# Patient Record
Sex: Female | Born: 1980 | Race: Black or African American | Hispanic: No | Marital: Married | Smoking: Former smoker
Health system: Southern US, Community
[De-identification: ages and names within clinical notes are randomized; demographics above are authoritative.]

## PROBLEM LIST (undated history)

## (undated) DIAGNOSIS — L989 Disorder of the skin and subcutaneous tissue, unspecified: Secondary | ICD-10-CM

## (undated) DIAGNOSIS — Z87891 Personal history of nicotine dependence: Secondary | ICD-10-CM

## (undated) DIAGNOSIS — Z8619 Personal history of other infectious and parasitic diseases: Secondary | ICD-10-CM

## (undated) DIAGNOSIS — D219 Benign neoplasm of connective and other soft tissue, unspecified: Secondary | ICD-10-CM

## (undated) DIAGNOSIS — J302 Other seasonal allergic rhinitis: Secondary | ICD-10-CM

## (undated) DIAGNOSIS — K5900 Constipation, unspecified: Secondary | ICD-10-CM

## (undated) DIAGNOSIS — K5790 Diverticulosis of intestine, part unspecified, without perforation or abscess without bleeding: Secondary | ICD-10-CM

## (undated) DIAGNOSIS — F419 Anxiety disorder, unspecified: Secondary | ICD-10-CM

## (undated) DIAGNOSIS — K7689 Other specified diseases of liver: Secondary | ICD-10-CM

## (undated) DIAGNOSIS — D259 Leiomyoma of uterus, unspecified: Secondary | ICD-10-CM

## (undated) HISTORY — DX: Diverticulosis of intestine, part unspecified, without perforation or abscess without bleeding: K57.90

## (undated) HISTORY — PX: COLONOSCOPY: SHX174

## (undated) HISTORY — DX: Disorder of the skin and subcutaneous tissue, unspecified: L98.9

## (undated) HISTORY — DX: Constipation, unspecified: K59.00

## (undated) HISTORY — DX: Other seasonal allergic rhinitis: J30.2

## (undated) HISTORY — DX: Leiomyoma of uterus, unspecified: D25.9

## (undated) HISTORY — PX: BREAST BIOPSY: SHX20

## (undated) HISTORY — DX: Personal history of nicotine dependence: Z87.891

## (undated) HISTORY — DX: Other specified diseases of liver: K76.89

## (undated) HISTORY — DX: Personal history of other infectious and parasitic diseases: Z86.19

---

## 2004-03-03 ENCOUNTER — Emergency Department (HOSPITAL_COMMUNITY): Admission: EM | Admit: 2004-03-03 | Discharge: 2004-03-03 | Payer: Self-pay | Admitting: Emergency Medicine

## 2004-08-08 ENCOUNTER — Emergency Department (HOSPITAL_COMMUNITY): Admission: EM | Admit: 2004-08-08 | Discharge: 2004-08-08 | Payer: Self-pay | Admitting: Emergency Medicine

## 2004-09-10 ENCOUNTER — Other Ambulatory Visit: Admission: RE | Admit: 2004-09-10 | Discharge: 2004-09-10 | Payer: Self-pay | Admitting: Obstetrics and Gynecology

## 2008-02-14 ENCOUNTER — Emergency Department (HOSPITAL_COMMUNITY): Admission: EM | Admit: 2008-02-14 | Discharge: 2008-02-14 | Payer: Self-pay | Admitting: Emergency Medicine

## 2009-11-01 HISTORY — PX: WISDOM TOOTH EXTRACTION: SHX21

## 2010-05-06 ENCOUNTER — Emergency Department (HOSPITAL_COMMUNITY): Admission: EM | Admit: 2010-05-06 | Discharge: 2010-05-06 | Payer: Self-pay | Admitting: Family Medicine

## 2012-01-10 ENCOUNTER — Other Ambulatory Visit: Payer: Self-pay | Admitting: Obstetrics and Gynecology

## 2012-01-10 DIAGNOSIS — D219 Benign neoplasm of connective and other soft tissue, unspecified: Secondary | ICD-10-CM

## 2012-01-21 ENCOUNTER — Ambulatory Visit
Admission: RE | Admit: 2012-01-21 | Discharge: 2012-01-21 | Disposition: A | Discharge status: Home / Self Care | Payer: Managed Care, Other (non HMO) | Source: Ambulatory Visit | Attending: Obstetrics and Gynecology | Admitting: Obstetrics and Gynecology

## 2012-01-21 DIAGNOSIS — D219 Benign neoplasm of connective and other soft tissue, unspecified: Secondary | ICD-10-CM

## 2012-01-21 MED ORDER — GADOBENATE DIMEGLUMINE 529 MG/ML IV SOLN: 12.0000 mL | Freq: Once | INTRAVENOUS | Status: AC | PRN

## 2012-04-19 ENCOUNTER — Encounter (HOSPITAL_COMMUNITY): Payer: Self-pay | Admitting: Pharmacist

## 2012-04-20 ENCOUNTER — Encounter (HOSPITAL_COMMUNITY)
Admission: RE | Admit: 2012-04-20 | Discharge: 2012-04-20 | Disposition: A | Discharge status: Home / Self Care | Payer: Managed Care, Other (non HMO) | Source: Ambulatory Visit | Attending: Obstetrics and Gynecology | Admitting: Obstetrics and Gynecology

## 2012-04-20 ENCOUNTER — Encounter (HOSPITAL_COMMUNITY): Payer: Self-pay

## 2012-04-20 HISTORY — DX: Benign neoplasm of connective and other soft tissue, unspecified: D21.9

## 2012-04-20 LAB — SURGICAL PCR SCREEN: Staphylococcus aureus: POSITIVE — AB

## 2012-04-20 NOTE — Patient Instructions (Addendum)
   Your procedure is scheduled on:Wednesday July 3rd  Enter through the [employer] of [facility] at: 11:30am Pick up the phone at the desk and dial [phone] and inform us of your arrival.  Please call this number if you have any problems the morning of surgery: [phone]  Remember: Do not eat food after midnight: Tuesday Do not drink clear liquids after: 9am Wednesday Take these medicines the morning of surgery with a SIP OF WATER:none  Do not wear jewelry, make-up, or FINGER nail polish Do not wear lotions, powders, perfumes or deodorant. Do not shave 48 hours prior to surgery. Do not bring valuables to the hospital. Contacts, dentures or bridgework may not be worn into surgery.  Leave suitcase in the car. After Surgery it may be brought to your room. For patients being admitted to the hospital, checkout time is 11:00am the day of discharge.  Patients discharged on the day of surgery will not be allowed to drive home.     Remember to use your hibiclens as instructed.Please shower with 1/2 bottle the evening before your surgery and the other 1/2 bottle the morning of surgery. Neck down avoiding private area.

## 2012-04-25 ENCOUNTER — Other Ambulatory Visit: Payer: Self-pay | Admitting: Obstetrics and Gynecology

## 2012-05-03 ENCOUNTER — Encounter (HOSPITAL_COMMUNITY): Payer: Self-pay

## 2012-05-03 ENCOUNTER — Inpatient Hospital Stay (HOSPITAL_COMMUNITY)
Admission: RE | Admit: 2012-05-03 | Discharge: 2012-05-04 | DRG: 743 | Disposition: A | Discharge status: Home / Self Care | Payer: Managed Care, Other (non HMO) | Source: Ambulatory Visit | Attending: Obstetrics and Gynecology | Admitting: Obstetrics and Gynecology

## 2012-05-03 ENCOUNTER — Encounter (HOSPITAL_COMMUNITY)
Admission: RE | Disposition: A | Discharge status: Home / Self Care | Payer: Self-pay | Source: Ambulatory Visit | Attending: Obstetrics and Gynecology

## 2012-05-03 ENCOUNTER — Ambulatory Visit (HOSPITAL_COMMUNITY): Payer: Managed Care, Other (non HMO)

## 2012-05-03 DIAGNOSIS — D251 Intramural leiomyoma of uterus: Secondary | ICD-10-CM | POA: Diagnosis present

## 2012-05-03 DIAGNOSIS — Z9889 Other specified postprocedural states: Secondary | ICD-10-CM

## 2012-05-03 DIAGNOSIS — D252 Subserosal leiomyoma of uterus: Secondary | ICD-10-CM | POA: Diagnosis present

## 2012-05-03 DIAGNOSIS — D250 Submucous leiomyoma of uterus: Principal | ICD-10-CM | POA: Diagnosis present

## 2012-05-03 HISTORY — PX: ROBOT ASSISTED MYOMECTOMY: SHX5142

## 2012-05-03 HISTORY — PX: MYOMECTOMY: SHX85

## 2012-05-03 LAB — CBC
HCT: 40.7 % (ref 36.0–46.0)
Hemoglobin: 12.9 g/dL (ref 12.0–15.0)
MCHC: 31.7 g/dL (ref 30.0–36.0)
MCV: 83.1 fL (ref 78.0–100.0)

## 2012-05-03 SURGERY — ROBOTIC ASSISTED MYOMECTOMY
Anesthesia: General | Site: Abdomen | Wound class: Clean Contaminated

## 2012-05-03 MED ORDER — KETOROLAC TROMETHAMINE 30 MG/ML IJ SOLN
INTRAMUSCULAR | Status: AC
  Filled 2012-05-03: qty 1

## 2012-05-03 MED ORDER — OXYCODONE-ACETAMINOPHEN 5-325 MG PO TABS
1.0000 | ORAL_TABLET | ORAL | Status: DC | PRN
  Administered 2012-05-04 (×2): 2 via ORAL
  Filled 2012-05-03 (×2): qty 2

## 2012-05-03 MED ORDER — KETOROLAC TROMETHAMINE 30 MG/ML IJ SOLN
30.0000 mg | Freq: Four times a day (QID) | INTRAMUSCULAR | Status: DC
  Administered 2012-05-03 – 2012-05-04 (×3): 30 mg via INTRAVENOUS
  Filled 2012-05-03 (×2): qty 1

## 2012-05-03 MED ORDER — NEOSTIGMINE METHYLSULFATE 1 MG/ML IJ SOLN
INTRAMUSCULAR | Status: DC | PRN
  Administered 2012-05-03: 3 mg via INTRAVENOUS

## 2012-05-03 MED ORDER — CEFAZOLIN SODIUM-DEXTROSE 2-3 GM-% IV SOLR
INTRAVENOUS | Status: AC
  Filled 2012-05-03: qty 50

## 2012-05-03 MED ORDER — NEOSTIGMINE METHYLSULFATE 1 MG/ML IJ SOLN
INTRAMUSCULAR | Status: AC
  Filled 2012-05-03: qty 10

## 2012-05-03 MED ORDER — MEPERIDINE HCL 25 MG/ML IJ SOLN: 6.2500 mg | INTRAMUSCULAR | Status: DC | PRN

## 2012-05-03 MED ORDER — KETOROLAC TROMETHAMINE 30 MG/ML IJ SOLN: 30.0000 mg | Freq: Four times a day (QID) | INTRAMUSCULAR | Status: DC

## 2012-05-03 MED ORDER — MORPHINE SULFATE 4 MG/ML IJ SOLN: 1.0000 mg | INTRAMUSCULAR | Status: DC | PRN

## 2012-05-03 MED ORDER — PHENYLEPHRINE HCL 10 MG/ML IJ SOLN
INTRAMUSCULAR | Status: DC | PRN
  Administered 2012-05-03: 40 ug via INTRAVENOUS
  Administered 2012-05-03: 80 ug via INTRAVENOUS
  Administered 2012-05-03 (×2): 40 ug via INTRAVENOUS

## 2012-05-03 MED ORDER — PANTOPRAZOLE SODIUM 40 MG PO TBEC
40.0000 mg | DELAYED_RELEASE_TABLET | Freq: Every day | ORAL | Status: DC
  Administered 2012-05-03 – 2012-05-04 (×2): 40 mg via ORAL
  Filled 2012-05-03 (×3): qty 1

## 2012-05-03 MED ORDER — ARTIFICIAL TEARS OP OINT
TOPICAL_OINTMENT | OPHTHALMIC | Status: AC
  Filled 2012-05-03: qty 3.5

## 2012-05-03 MED ORDER — VASOPRESSIN 20 UNIT/ML IJ SOLN
INTRAMUSCULAR | Status: AC
  Filled 2012-05-03: qty 2

## 2012-05-03 MED ORDER — GLYCOPYRROLATE 0.2 MG/ML IJ SOLN
INTRAMUSCULAR | Status: DC | PRN
  Administered 2012-05-03: .6 mg via INTRAVENOUS

## 2012-05-03 MED ORDER — FENTANYL CITRATE 0.05 MG/ML IJ SOLN
INTRAMUSCULAR | Status: AC
  Filled 2012-05-03: qty 5

## 2012-05-03 MED ORDER — LIDOCAINE HCL (CARDIAC) 20 MG/ML IV SOLN
INTRAVENOUS | Status: AC
  Filled 2012-05-03: qty 5

## 2012-05-03 MED ORDER — FENTANYL CITRATE 0.05 MG/ML IJ SOLN
INTRAMUSCULAR | Status: DC | PRN
  Administered 2012-05-03: 100 ug via INTRAVENOUS
  Administered 2012-05-03 (×8): 50 ug via INTRAVENOUS

## 2012-05-03 MED ORDER — MIDAZOLAM HCL 2 MG/2ML IJ SOLN
INTRAMUSCULAR | Status: AC
  Filled 2012-05-03: qty 2

## 2012-05-03 MED ORDER — NALOXONE HCL 0.4 MG/ML IJ SOLN: 0.4000 mg | INTRAMUSCULAR | Status: DC | PRN

## 2012-05-03 MED ORDER — METOCLOPRAMIDE HCL 5 MG/ML IJ SOLN: 10.0000 mg | Freq: Once | INTRAMUSCULAR | Status: DC | PRN

## 2012-05-03 MED ORDER — DEXTROSE IN LACTATED RINGERS 5 % IV SOLN
INTRAVENOUS | Status: DC
  Administered 2012-05-03 – 2012-05-04 (×2): via INTRAVENOUS

## 2012-05-03 MED ORDER — DEXAMETHASONE SODIUM PHOSPHATE 10 MG/ML IJ SOLN
INTRAMUSCULAR | Status: AC
  Filled 2012-05-03: qty 1

## 2012-05-03 MED ORDER — PROPOFOL 10 MG/ML IV EMUL
INTRAVENOUS | Status: DC | PRN
  Administered 2012-05-03: 180 mg via INTRAVENOUS

## 2012-05-03 MED ORDER — BUPIVACAINE HCL (PF) 0.25 % IJ SOLN
INTRAMUSCULAR | Status: DC | PRN
  Administered 2012-05-03: 15 mL

## 2012-05-03 MED ORDER — MIDAZOLAM HCL 5 MG/5ML IJ SOLN
INTRAMUSCULAR | Status: DC | PRN
  Administered 2012-05-03: 2 mg via INTRAVENOUS

## 2012-05-03 MED ORDER — MENTHOL 3 MG MT LOZG: 1.0000 | LOZENGE | OROMUCOSAL | Status: DC | PRN

## 2012-05-03 MED ORDER — PROPOFOL 10 MG/ML IV EMUL
INTRAVENOUS | Status: AC
  Filled 2012-05-03: qty 20

## 2012-05-03 MED ORDER — ACETAMINOPHEN 10 MG/ML IV SOLN
1000.0000 mg | Freq: Four times a day (QID) | INTRAVENOUS | Status: DC
  Administered 2012-05-03: 1000 mg via INTRAVENOUS
  Filled 2012-05-03: qty 100

## 2012-05-03 MED ORDER — ONDANSETRON HCL 4 MG/2ML IJ SOLN: 4.0000 mg | Freq: Four times a day (QID) | INTRAMUSCULAR | Status: DC | PRN

## 2012-05-03 MED ORDER — CEFAZOLIN SODIUM 1-5 GM-% IV SOLN
1.0000 g | Freq: Three times a day (TID) | INTRAVENOUS | Status: AC
  Administered 2012-05-03 – 2012-05-04 (×2): 1 g via INTRAVENOUS
  Filled 2012-05-03 (×2): qty 50

## 2012-05-03 MED ORDER — LACTATED RINGERS IV SOLN
INTRAVENOUS | Status: DC
Start: 2012-05-03 — End: 2012-05-03
  Administered 2012-05-03 (×5): via INTRAVENOUS

## 2012-05-03 MED ORDER — HYDROMORPHONE HCL PF 1 MG/ML IJ SOLN: 0.2000 mg | INTRAMUSCULAR | Status: DC | PRN

## 2012-05-03 MED ORDER — SODIUM CHLORIDE 0.9 % IJ SOLN: 9.0000 mL | INTRAMUSCULAR | Status: DC | PRN

## 2012-05-03 MED ORDER — ONDANSETRON HCL 4 MG/2ML IJ SOLN
INTRAMUSCULAR | Status: DC | PRN
  Administered 2012-05-03: 4 mg via INTRAVENOUS

## 2012-05-03 MED ORDER — CEFAZOLIN SODIUM-DEXTROSE 2-3 GM-% IV SOLR
2.0000 g | INTRAVENOUS | Status: AC
  Administered 2012-05-03: 2 g via INTRAVENOUS

## 2012-05-03 MED ORDER — ARTIFICIAL TEARS OP OINT
TOPICAL_OINTMENT | OPHTHALMIC | Status: DC | PRN
  Administered 2012-05-03: 1 via OPHTHALMIC

## 2012-05-03 MED ORDER — DIPHENHYDRAMINE HCL 12.5 MG/5ML PO ELIX: 12.5000 mg | ORAL_SOLUTION | Freq: Four times a day (QID) | ORAL | Status: DC | PRN

## 2012-05-03 MED ORDER — HYDROMORPHONE 0.3 MG/ML IV SOLN
INTRAVENOUS | Status: DC
  Administered 2012-05-03: 19:00:00 via INTRAVENOUS
  Administered 2012-05-03: 0.999 mg via INTRAVENOUS
  Administered 2012-05-04: 0.2 mg via INTRAVENOUS
  Administered 2012-05-04: 0.999 mg via INTRAVENOUS
  Filled 2012-05-03: qty 25

## 2012-05-03 MED ORDER — DIPHENHYDRAMINE HCL 50 MG/ML IJ SOLN: 12.5000 mg | Freq: Four times a day (QID) | INTRAMUSCULAR | Status: DC | PRN

## 2012-05-03 MED ORDER — ACETAMINOPHEN 10 MG/ML IV SOLN
INTRAVENOUS | Status: DC | PRN
  Administered 2012-05-03: 1000 mg via INTRAVENOUS

## 2012-05-03 MED ORDER — IBUPROFEN 800 MG PO TABS
800.0000 mg | ORAL_TABLET | Freq: Three times a day (TID) | ORAL | Status: DC | PRN
  Administered 2012-05-04: 800 mg via ORAL
  Filled 2012-05-03: qty 1

## 2012-05-03 MED ORDER — ROCURONIUM BROMIDE 50 MG/5ML IV SOLN
INTRAVENOUS | Status: AC
  Filled 2012-05-03: qty 1

## 2012-05-03 MED ORDER — GLYCOPYRROLATE 0.2 MG/ML IJ SOLN
INTRAMUSCULAR | Status: AC
  Filled 2012-05-03: qty 2

## 2012-05-03 MED ORDER — SCOPOLAMINE 1 MG/3DAYS TD PT72
MEDICATED_PATCH | TRANSDERMAL | Status: AC
  Administered 2012-05-03: 1.5 mg via TRANSDERMAL
  Filled 2012-05-03: qty 1

## 2012-05-03 MED ORDER — LACTATED RINGERS IR SOLN
Status: DC | PRN
  Administered 2012-05-03: 3000 mL

## 2012-05-03 MED ORDER — LIDOCAINE HCL (CARDIAC) 20 MG/ML IV SOLN
INTRAVENOUS | Status: DC | PRN
  Administered 2012-05-03: 40 mg via INTRAVENOUS

## 2012-05-03 MED ORDER — ONDANSETRON HCL 4 MG/2ML IJ SOLN
INTRAMUSCULAR | Status: AC
  Filled 2012-05-03: qty 2

## 2012-05-03 MED ORDER — INDIGOTINDISULFONATE SODIUM 8 MG/ML IJ SOLN
INTRAMUSCULAR | Status: AC
  Filled 2012-05-03: qty 5

## 2012-05-03 MED ORDER — BUPIVACAINE HCL (PF) 0.25 % IJ SOLN
INTRAMUSCULAR | Status: AC
  Filled 2012-05-03: qty 30

## 2012-05-03 MED ORDER — ROCURONIUM BROMIDE 100 MG/10ML IV SOLN
INTRAVENOUS | Status: DC | PRN
  Administered 2012-05-03: 10 mg via INTRAVENOUS
  Administered 2012-05-03: 20 mg via INTRAVENOUS
  Administered 2012-05-03: 5 mg via INTRAVENOUS
  Administered 2012-05-03 (×2): 10 mg via INTRAVENOUS
  Administered 2012-05-03: 35 mg via INTRAVENOUS

## 2012-05-03 MED ORDER — DEXAMETHASONE SODIUM PHOSPHATE 10 MG/ML IJ SOLN
INTRAMUSCULAR | Status: DC | PRN
  Administered 2012-05-03: 10 mg via INTRAVENOUS

## 2012-05-03 MED ORDER — ONDANSETRON HCL 4 MG PO TABS: 4.0000 mg | ORAL_TABLET | Freq: Four times a day (QID) | ORAL | Status: DC | PRN

## 2012-05-03 MED ORDER — SCOPOLAMINE 1 MG/3DAYS TD PT72
1.0000 | MEDICATED_PATCH | TRANSDERMAL | Status: DC
  Administered 2012-05-03: 1.5 mg via TRANSDERMAL

## 2012-05-03 MED ORDER — SODIUM CHLORIDE 0.9 % IR SOLN
Status: DC | PRN
  Administered 2012-05-03: 1000 mL

## 2012-05-03 MED ORDER — ZOLPIDEM TARTRATE 5 MG PO TABS: 5.0000 mg | ORAL_TABLET | Freq: Every evening | ORAL | Status: DC | PRN

## 2012-05-03 MED ORDER — PHENYLEPHRINE 40 MCG/ML (10ML) SYRINGE FOR IV PUSH (FOR BLOOD PRESSURE SUPPORT)
PREFILLED_SYRINGE | INTRAVENOUS | Status: AC
  Filled 2012-05-03: qty 5

## 2012-05-03 MED ORDER — VASOPRESSIN 20 UNIT/ML IJ SOLN
INTRAVENOUS | Status: DC | PRN
  Administered 2012-05-03: 13:00:00 via INTRAMUSCULAR

## 2012-05-03 SURGICAL SUPPLY — 78 items
BARRIER ADHS 3X4 INTERCEED (GAUZE/BANDAGES/DRESSINGS) ×4 IMPLANT
CABLE HIGH FREQUENCY MONO STRZ (ELECTRODE) ×2 IMPLANT
CANISTER SUCTION 2500CC (MISCELLANEOUS) ×2 IMPLANT
CHLORAPREP W/TINT 26ML (MISCELLANEOUS) ×2 IMPLANT
CLOTH BEACON ORANGE TIMEOUT ST (SAFETY) ×2 IMPLANT
CONT SPEC PATH 64OZ SNAP LID (MISCELLANEOUS) ×2 IMPLANT
COVER MAYO STAND STRL (DRAPES) ×2 IMPLANT
COVER TABLE BACK 60X90 (DRAPES) ×4 IMPLANT
COVER TIP SHEARS 8 DVNC (MISCELLANEOUS) ×1 IMPLANT
COVER TIP SHEARS 8MM DA VINCI (MISCELLANEOUS) ×1
DECANTER SPIKE VIAL GLASS SM (MISCELLANEOUS) ×2 IMPLANT
DERMABOND ADVANCED (GAUZE/BANDAGES/DRESSINGS) ×1
DERMABOND ADVANCED .7 DNX12 (GAUZE/BANDAGES/DRESSINGS) ×1 IMPLANT
DILATOR CANAL MILEX (MISCELLANEOUS) ×2 IMPLANT
DRAPE HUG U DISPOSABLE (DRAPE) ×2 IMPLANT
DRAPE LG THREE QUARTER DISP (DRAPES) ×4 IMPLANT
DRAPE WARM FLUID 44X44 (DRAPE) ×2 IMPLANT
DRSG COVADERM 4X10 (GAUZE/BANDAGES/DRESSINGS) ×2 IMPLANT
ELECT NEEDLE TIP 2.8 STRL (NEEDLE) ×2 IMPLANT
ELECT REM PT RETURN 9FT ADLT (ELECTROSURGICAL) ×2
ELECTRODE REM PT RTRN 9FT ADLT (ELECTROSURGICAL) ×1 IMPLANT
EVACUATOR SMOKE 8.L (FILTER) ×2 IMPLANT
FILTER STRAW FLUID ASPIR (MISCELLANEOUS) IMPLANT
GAUZE SPONGE 4X4 12PLY STRL LF (GAUZE/BANDAGES/DRESSINGS) ×2 IMPLANT
GAUZE SPONGE 4X4 16PLY XRAY LF (GAUZE/BANDAGES/DRESSINGS) ×2 IMPLANT
GLOVE BIO SURGEON STRL SZ 6.5 (GLOVE) ×8 IMPLANT
GLOVE BIOGEL PI IND STRL 7.0 (GLOVE) ×3 IMPLANT
GLOVE BIOGEL PI INDICATOR 7.0 (GLOVE) ×3
GOWN PREVENTION PLUS LG XLONG (DISPOSABLE) ×6 IMPLANT
GOWN STRL REIN XL XLG (GOWN DISPOSABLE) ×14 IMPLANT
KIT ACCESSORY DA VINCI DISP (KITS) ×1
KIT ACCESSORY DVNC DISP (KITS) ×1 IMPLANT
NEEDLE HYPO 25X1 1.5 SAFETY (NEEDLE) ×2 IMPLANT
NEEDLE INSUFFLATION 14GA 120MM (NEEDLE) ×2 IMPLANT
NEEDLE SPNL 22GX9.0 ACCUTG (NEEDLE) ×2 IMPLANT
NS IRRIG 1000ML POUR BTL (IV SOLUTION) ×2 IMPLANT
OCCLUDER COLPOPNEUMO (BALLOONS) ×2 IMPLANT
PACK LAVH (CUSTOM PROCEDURE TRAY) ×2 IMPLANT
PAD OB MATERNITY 4.3X12.25 (PERSONAL CARE ITEMS) IMPLANT
SET IRRIG TUBING LAPAROSCOPIC (IRRIGATION / IRRIGATOR) ×2 IMPLANT
SOLUTION ELECTROLUBE (MISCELLANEOUS) ×2 IMPLANT
SPONGE LAP 18X18 X RAY DECT (DISPOSABLE) ×10 IMPLANT
STAPLER VISISTAT 35W (STAPLE) IMPLANT
STRIP CLOSURE SKIN 1/2X4 (GAUZE/BANDAGES/DRESSINGS) ×2 IMPLANT
SUT MNCRL AB 4-0 PS2 18 (SUTURE) IMPLANT
SUT MON AB 3-0 SH 27 (SUTURE) ×6
SUT MON AB 3-0 SH27 (SUTURE) ×6 IMPLANT
SUT VIC AB 0 CT1 18XCR BRD8 (SUTURE) ×1 IMPLANT
SUT VIC AB 0 CT1 27 (SUTURE) ×2
SUT VIC AB 0 CT1 27XBRD ANBCTR (SUTURE) ×2 IMPLANT
SUT VIC AB 0 CT1 36 (SUTURE) IMPLANT
SUT VIC AB 0 CT1 8-18 (SUTURE) ×1
SUT VIC AB 2-0 CT1 27 (SUTURE) ×1
SUT VIC AB 2-0 CT1 TAPERPNT 27 (SUTURE) ×1 IMPLANT
SUT VIC AB 2-0 SH 27 (SUTURE) ×6
SUT VIC AB 2-0 SH 27XBRD (SUTURE) ×6 IMPLANT
SUT VIC AB 4-0 PS2 27 (SUTURE) IMPLANT
SUT VICRYL 0 UR6 27IN ABS (SUTURE) ×2 IMPLANT
SUT VICRYL RAPIDE 4/0 PS 2 (SUTURE) ×8 IMPLANT
SYR 50ML LL SCALE MARK (SYRINGE) ×2 IMPLANT
SYR CONTROL 10ML LL (SYRINGE) ×2 IMPLANT
SYSTEM CONVERTIBLE TROCAR (TROCAR) IMPLANT
TIP UTERINE 5.1X6CM LAV DISP (MISCELLANEOUS) IMPLANT
TIP UTERINE 6.7X10CM GRN DISP (MISCELLANEOUS) ×2 IMPLANT
TIP UTERINE 6.7X6CM WHT DISP (MISCELLANEOUS) IMPLANT
TIP UTERINE 6.7X8CM BLUE DISP (MISCELLANEOUS) IMPLANT
TOWEL OR 17X24 6PK STRL BLUE (TOWEL DISPOSABLE) ×6 IMPLANT
TRAY FOLEY BAG SILVER LF 14FR (CATHETERS) IMPLANT
TRAY FOLEY CATH 14FR (SET/KITS/TRAYS/PACK) IMPLANT
TROCAR 12M 150ML BLUNT (TROCAR) IMPLANT
TROCAR DISP BLADELESS 8 DVNC (TROCAR) ×1 IMPLANT
TROCAR DISP BLADELESS 8MM (TROCAR) ×1
TROCAR XCEL 12X100 BLDLESS (ENDOMECHANICALS) ×2 IMPLANT
TROCAR Z-THREAD 12X150 (TROCAR) ×2 IMPLANT
TUBING CONNECTOR 18X5MM (MISCELLANEOUS) ×2 IMPLANT
TUBING FILTER THERMOFLATOR (ELECTROSURGICAL) ×2 IMPLANT
WARMER LAPAROSCOPE (MISCELLANEOUS) ×2 IMPLANT
WATER STERILE IRR 1000ML POUR (IV SOLUTION) ×6 IMPLANT

## 2012-05-03 NOTE — Anesthesia Preprocedure Evaluation (Signed)
Anesthesia Evaluation    Airway Mallampati: II TM Distance: >3 FB Neck ROM: Full    Dental No notable dental hx. (+) Teeth Intact   Pulmonary former smoker breath sounds clear to auscultation  Pulmonary exam normal       Cardiovascular negative cardio ROS  Rhythm:Regular Rate:Normal     Neuro/Psych negative neurological ROS  negative psych ROS   GI/Hepatic negative GI ROS, Neg liver ROS,   Endo/Other  negative endocrine ROS  Renal/GU negative Renal ROS  negative genitourinary   Musculoskeletal negative musculoskeletal ROS (+)   Abdominal   Peds  Hematology negative hematology ROS (+)   Anesthesia Other Findings   Reproductive/Obstetrics Fibroid Uterus                           Anesthesia Physical Anesthesia Plan  ASA: I  Anesthesia Plan: General   Post-op Pain Management:    Induction: Intravenous  Airway Management Planned: Oral ETT  Additional Equipment:   Intra-op Plan:   Post-operative Plan: Extubation in OR  Informed Consent: I have reviewed the patients History and Physical, chart, labs and discussed the procedure including the risks, benefits and alternatives for the proposed anesthesia with the patient or authorized representative who has indicated his/her understanding and acceptance.   Dental advisory given  Plan Discussed with: Anesthesiologist, CRNA and Surgeon  Anesthesia Plan Comments:         Anesthesia Quick Evaluation

## 2012-05-03 NOTE — Transfer of Care (Signed)
Immediate Anesthesia Transfer of Care Note  Patient: Susan Drake  Procedure(s) Performed: Procedure(s) (LRB): ROBOTIC ASSISTED MYOMECTOMY (N/A) MYOMECTOMY (N/A)  Patient Location: PACU  Anesthesia Type: General  Level of Consciousness: awake, alert , oriented and patient cooperative  Airway & Oxygen Therapy: Patient Spontanous Breathing and Patient connected to nasal cannula oxygen  Post-op Assessment: Report given to PACU RN, Post -op Vital signs reviewed and stable and Patient moving all extremities X 4  Post vital signs: Reviewed and stable  Complications: No apparent anesthesia complications

## 2012-05-03 NOTE — Transfer of Care (Signed)
Immediate Anesthesia Transfer of Care Note  Patient: Susan Drake  Procedure(s) Performed: Procedure(s) (LRB): ROBOTIC ASSISTED MYOMECTOMY (N/A) MYOMECTOMY (N/A)  Patient Location: PACU  Anesthesia Type: General  Level of Consciousness: awake, alert , oriented and patient cooperative  Airway & Oxygen Therapy: Patient Spontanous Breathing and Patient connected to nasal cannula oxygen  Post-op Assessment: Report given to PACU RN and Post -op Vital signs reviewed and stable  Post vital signs: Reviewed and stable  Complications: No apparent anesthesia complications

## 2012-05-03 NOTE — Brief Op Note (Signed)
05/03/2012  4:15 PM  PATIENT:  Susan Drake  31 y.o. female  PRE-OPERATIVE DIAGNOSIS:  Enlarging Uterine Fibroids  POST-OPERATIVE DIAGNOSIS:  Enlarging Uterine Fibroids  PROCEDURE:  Procedure(s) (LRB):DAVINCI ROBOTIC ASSISTED MYOMECTOMY (N/A), EXPLORATORY LAPAROTOMY, MYOMECTOMY (N/A)  SURGEON:  Surgeon(s) and Role:    * Charon Smedberg Cathie Beams, MD - Primary    * Lenoard Aden, MD - Assisting  PHYSICIAN ASSISTANT:   ASSISTANTS: Olivia Mackie, MD  Findings: large pedunculated fibroid, multiple IM/SS/intraligmentary, SM fibroid. Nl tubes and ovaries ANESTHESIA:   general  EBL:  Total I/O In: 2000 [I.V.:2000] Out: 695 [Urine:370; Blood:325]  BLOOD ADMINISTERED:none  DRAINS: none   LOCAL MEDICATIONS USED:  MARCAINE     SPECIMEN:  Source of Specimen:  myoma x 19  DISPOSITION OF SPECIMEN:  PATHOLOGY  COUNTS:  YES  TOURNIQUET:  * No tourniquets in log *  DICTATION: .Other Dictation: Dictation Number   PLAN OF CARE: Admit to inpatient   PATIENT DISPOSITION:  PACU - hemodynamically stable.   Delay start of Pharmacological VTE agent (>24hrs) due to surgical blood loss or risk of bleeding: no

## 2012-05-03 NOTE — Anesthesia Postprocedure Evaluation (Signed)
Anesthesia Post Note  Patient: Susan Drake  Procedure(s) Performed: Procedure(s) (LRB): ROBOTIC ASSISTED MYOMECTOMY (N/A) MYOMECTOMY (N/A)  Anesthesia type: General  Patient location: PACU  Post pain: Pain level controlled  Post assessment: Post-op Vital signs reviewed  Last Vitals:  Filed Vitals:   05/03/12 1630  BP: 133/84  Pulse: 91  Temp:   Resp: 15    Post vital signs: Reviewed  Level of consciousness: sedated  Complications: No apparent anesthesia complicationsfj

## 2012-05-04 ENCOUNTER — Encounter (HOSPITAL_COMMUNITY): Payer: Self-pay | Admitting: General Practice

## 2012-05-04 LAB — CBC
MCH: 26.7 pg (ref 26.0–34.0)
MCHC: 32.2 g/dL (ref 30.0–36.0)
RDW: 13.1 % (ref 11.5–15.5)

## 2012-05-04 MED ORDER — OXYCODONE-ACETAMINOPHEN 5-325 MG PO TABS: 1.0000 | ORAL_TABLET | ORAL | Status: AC | PRN

## 2012-05-04 MED ORDER — IBUPROFEN 800 MG PO TABS: 800.0000 mg | ORAL_TABLET | Freq: Three times a day (TID) | ORAL | Status: AC | PRN

## 2012-05-04 NOTE — Progress Notes (Signed)
UR Chart review completed.  

## 2012-05-04 NOTE — Discharge Summary (Signed)
Physician Discharge Summary  Patient ID: Susan Drake MRN: [id number] DOB/AGE: 32-28-1982 32 y.o.  Admit date: 05/03/2012 Discharge date: 05/04/2012  Admission Diagnoses: enlarging fibroid  Discharge Diagnoses: enlarging fibroid Active Problems:  * No active hospital problems. *    Discharged Condition: stable  Hospital Course: Pt was admitted to [facility]. She underwent surgery and had uncomplicated postop course  Consults: None  Significant Diagnostic Studies: labs: [id number], wbc 12, hgb 9.8, HCT 29  Treatments: surgery: davinci robotic myomectomy, exp lap, myomectomy.   Discharge Exam: Blood pressure 105/65, pulse 64, temperature 98.1 F (36.7 C), temperature source Oral, resp. rate 16, height 5\' 2"  (1.575 m), weight 63.504 kg (140 lb), SpO2 98.00%. General appearance: alert, cooperative and no distress Resp: clear to auscultation bilaterally Cardio: regular rate and rhythm, S1, S2 normal, no murmur, click, rub or gallop GI: soft, non-tender; bowel sounds normal; no masses,  no organomegaly Pelvic: deferred and min bleeding on pad Extremities: no edema, redness or tenderness in the calves or thighs Incision/Wound:  Disposition: Final discharge disposition not confirmed  Discharge Orders    Future Orders Please Complete By Expires   Diet general      May walk up steps      No dressing needed      Discharge instructions      Comments:   Call if temperature greater than equal to 100.4, nothing per vagina for 4-6 weeks or severe nausea vomiting, increased incisional pain , drainage or redness in the incision site, showers no bath     Medication List  As of 05/04/2012 11:11 AM   TAKE these medications         Biotin 1000 MCG tablet   Take 1,000 mcg by mouth daily.      cholecalciferol 1000 UNITS tablet   Commonly known as: VITAMIN D   Take 1,000 Units by mouth daily.      folic acid 400 MCG tablet   Commonly known as: FOLVITE   Take 400 mcg by mouth daily.      ibuprofen  200 MG tablet   Commonly known as: ADVIL,MOTRIN   Take 800 mg by mouth 2 (two) times daily as needed. For menstrual cramps      ibuprofen 800 MG tablet   Commonly known as: ADVIL,MOTRIN   Take 1 tablet (800 mg total) by mouth every 8 (eight) hours as needed (mild pain).      oxyCODONE-acetaminophen 5-325 MG per tablet   Commonly known as: PERCOCET   Take 1-2 tablets by mouth every 3 (three) hours as needed (moderate to severe pain (when tolerating fluids)).           Follow-up Information    Follow up with Willie Loy A, MD in 6 weeks.   Contact information:   [address] [place] [address] [phone]          Signed: Serita Kyle 05/04/2012, 11:11 AM

## 2012-05-04 NOTE — Progress Notes (Signed)
Pt discharged to home with husband and mother.  Condition stable.  Pt to car via wheelchair with RN.  No equipment for home ordered at discharge. 

## 2012-05-04 NOTE — Addendum Note (Signed)
Addendum  created 05/04/12 1006 by Lincoln Brigham, CRNA   Modules edited:Notes Section

## 2012-05-04 NOTE — Op Note (Signed)
NAME:  Susan Drake, Susan Drake NO.:  [id number]  MEDICAL RECORD NO.:  [id number]  LOCATION:  9316                          FACILITY:  [facility]  PHYSICIAN:  Maxie Better, M.D.DATE OF BIRTH:  Mar 28, 1981  DATE OF PROCEDURE:  05/03/2012 DATE OF DISCHARGE:                              OPERATIVE REPORT   PREOPERATIVE DIAGNOSES: 1. Enlarging Fibroid uterus  PROCEDURE:  Da Vinci robotic myomectomy, exploratory laparotomy, multiple myomectomy.  POSTOPERATIVE DIAGNOSES: 1. Pedunculated/ subserosal/ intramural/ submucosal fibroid.  ANESTHESIA:  General.  SURGEON:  Maxie Better, MD  ASSISTANT:  Lenoard Aden, MD  DESCRIPTION OF PROCEDURE:  Under general anesthesia, the patient was positioned for robotic surgery.  Her uterus and mass extended to the umbilicus.  She was sterilely prepped and draped in usual fashion.  A bivalve speculum was placed in vagina.  Single-tooth tenaculum was placed in the anterior lip of the cervix.  The cervix was gently dilated and sounded to 10 cm.  A # 10 uterine manipulator was introduced and the balloon was inflated.  Indwelling Foley catheter was sterilely placed.  The bivalve speculum was removed.  Attention was then turned to the abdomen.  Mid way above the umbilical incision, 0.25% Marcaine was injected.  A vertical incision was then made.  Bleeding was noted.  The base was cauterized.  Veress needle was introduced, tested with normal saline.  Carbon dioxide was insufflated.  Opening pressure 5 was noted. 3 L of CO2 was insufflated.  Veress needle was then removed.  A 12 mm disposable trocar with sleeve was introduced into the abdomen without incident.  The robotic camera was introduced.  A large fundal fibroid was noted.  Normal liver edge was noted.  At that point, the 3 robotic 8-mm port sites were placed, 1 to the right of the robotic port site and 2 to the left a hand's breadth away from each other.  In the right lower  quadrant, a 12 mm assistant port was placed.  The robot was centrally docked at that point.  In the 3rd arm, the tenaculum was placed.  Prograsp was placed in arm 2, and the permanent cautery hook was then placed in arm 1.  I then went to the surgical consult.  At the surgical consult, further inspection resulted in the finding of a very large about 12 cm fibroid with a broad base. Additional uterus was then distorted by multiple other fibroids.  A pedunculated right fibroid was also noted.  Another left fibroid was noted.  The procedure was started by putting dilute solution of Pitressin through a needle, traversing the anterior abdominal wall to the base of the uterine fibroids.  Using the permanent hook and the Progress, the large fibroid was removed.  At that point, the uterus was distorted by multiple fibroids.  Posteriorly, there were multiple fibroids.  There was also an anterior left intraligamentous fibroid underneath the patient's fallopian tube, as well as on the right side, there was a cornual fibroid as well as a right lateral fibroid underneath the base.  The  pedunculated fibroid on the right was injected with dilute solution of Pitressin, and it was then removed.  Further reenactment of what appears to be the anatomy of the uterus was then able to be performed better to delineate as to whether or not this was feasible.  Given the concern for the fibroid in the broad ligament, and the one surrounding the fallopian tube on the right, decision was then made to abort the [employer] and open the patient.  The robot was undocked.  The instruments was then removed.  Using sterile technique, the patient remained in the dorsal lithotomy position.  The instruments in the vagina was removed.  A 0.25% Marcaine was then injected along the Pfannenstiel skin incision site.  Pfannenstiel skin incision was then made, carried down to the rectus fascia.  Rectus fascia was opened transversely.   Rectus fascia was then bluntly and sharply dissected off the rectus muscle in superior and inferior fashion.  The rectus muscle was split in midline.  The parietal peritoneum was entered sharply and extended.  The abdomen contained some blood probably from the base of the large fibroid that was removed.  The smaller fibroid was ultimately removed. The uterus was then exteriorized.  There was a fibroid in the anterior subserosal area, grasped with a single-tooth tenaculum.  The dilute solution of Pitressin was injected both on the anterior fibroid and the left anterior intraligamentous/left cornual fibroid.  All these were subsequently removed from the anterior position.  Posteriorly, the same fibroids again inspected and injected at bases with dilute Pitressin.  Deep in the posterior aspect of the uterus, there was a palpated fibroid, which was also removed.  Care was taken to remove the fibroid that involve the fallopian tubes.  Once these were all removed, the sites were then closed with a.  2-0 Vicryl on the left anterolateral fibroid site and the remaining others were closed with 0 Vicryl in the deep space followed by a 3-0 Monocryl sutures for the serosal surfaces.  Once all the fibroids spaces were closed, the abdomen was irrigated and suctioned.  The other large fibroid  Free floating was also removed.  Interceed was placed anteriorly and posteriorly over the suture lines.  The parietoperitoneum was then closed with 2-0 Vicryl.  The rectus fascia closed with 0 Vicryl x2.  The subcutaneous area was irrigated.  Small bleeders cauterized. Interrupted 2-0 plain suture was placed and 4-0 Vicryl suture was placed subcuticularly.  The robotic port site was closed with the fascial stitch of 0 Vicryl and the skin approximated using 4-0 Vicryl sutures and Dermabond placed.  SPECIMEN:  Multiple fibroids x 19 sent to pathology.  ESTIMATED BLOOD LOSS:  325 mL.  INTRAOPERATIVE FLUID:  2  L.  URINE OUTPUT:  225 mL.  Sponge and instrument counts x2 were correct.  COMPLICATIONS:  None.  The patient tolerated procedure well and was transferred to recovery room in stable condition.     Maxie Better, M.D.     [place]/MEDQ  D:  05/03/2012  T:  05/04/2012  Job:  [id number]

## 2012-05-04 NOTE — Progress Notes (Signed)
Subjective: Patient reports tolerating PO, + flatus and no problems voiding.  Ok with going home  Objective: I have reviewed patient's vital signs.  vital signs, intake and output and labs. Filed Vitals:   05/04/12 0946  BP: 105/65  Pulse: 64  Temp: 98.1 F (36.7 C)  Resp: 16   I/O last 3 completed shifts: In: 5318.3 [P.O.:320; I.V.:4998.3] Out: 2445 [Urine:2120; Blood:325] Total I/O In: -  Out: 450 [Urine:450]  Lab Results  Component Value Date   WBC 12.0* 05/04/2012   HGB 9.6* 05/04/2012   HCT 29.8* 05/04/2012   MCV 82.8 05/04/2012   PLT 138* 05/04/2012   No results found for this basename: CREATININE    EXAM General: alert, cooperative and no distress Resp: clear to auscultation bilaterally Cardio: regular rate and rhythm, S1, S2 normal, no murmur, click, rub or gallop GI: soft, non-tender; bowel sounds normal; no masses,  no organomegaly and incision: clean, dry and well approximated Extremities: no edema, redness or tenderness in the calves or thighs Vaginal Bleeding: minimal  Assessment: s/p Procedure(s): ROBOTIC ASSISTED MYOMECTOMY, exp laparotomy MYOMECTOMY: stable, progressing well and tolerating diet  Plan: Encourage ambulation Discontinue IV fluids Discharge home  LOS: 1 day    Stephine Langbehn A, MD 05/04/2012 11:12 AM    05/04/2012, 11:12 AM

## 2012-05-04 NOTE — Anesthesia Postprocedure Evaluation (Signed)
  Anesthesia Post-op Note  Patient: Susan Drake  Procedure(s) Performed: Procedure(s) (LRB): ROBOTIC ASSISTED MYOMECTOMY (N/A) MYOMECTOMY (N/A)  Patient Location: [facility]  Anesthesia Type: General  Level of Consciousness: awake, alert  and oriented  Airway and Oxygen Therapy: Patient Spontanous Breathing  Post-op Pain: mild  Post-op Assessment: Patient's Cardiovascular Status Stable, Respiratory Function Stable, Patent Airway, No signs of Nausea or vomiting, Adequate PO intake and Pain level controlled  Post-op Vital Signs: stable  Complications: No apparent anesthesia complications

## 2014-01-29 ENCOUNTER — Other Ambulatory Visit: Payer: Self-pay | Admitting: Internal Medicine

## 2014-01-29 DIAGNOSIS — N6321 Unspecified lump in the left breast, upper outer quadrant: Secondary | ICD-10-CM

## 2014-02-05 ENCOUNTER — Ambulatory Visit
Admission: RE | Admit: 2014-02-05 | Discharge: 2014-02-05 | Disposition: A | Discharge status: Home / Self Care | Payer: 59 | Source: Ambulatory Visit | Attending: Internal Medicine | Admitting: Internal Medicine

## 2014-02-05 ENCOUNTER — Other Ambulatory Visit: Payer: Self-pay | Admitting: Internal Medicine

## 2014-02-05 ENCOUNTER — Ambulatory Visit
Admission: RE | Admit: 2014-02-05 | Discharge: 2014-02-05 | Disposition: A | Discharge status: Home / Self Care | Payer: Managed Care, Other (non HMO) | Source: Ambulatory Visit | Attending: Internal Medicine | Admitting: Internal Medicine

## 2014-02-05 DIAGNOSIS — N6321 Unspecified lump in the left breast, upper outer quadrant: Secondary | ICD-10-CM

## 2014-02-08 ENCOUNTER — Ambulatory Visit
Admission: RE | Admit: 2014-02-08 | Discharge: 2014-02-08 | Disposition: A | Discharge status: Home / Self Care | Payer: 59 | Source: Ambulatory Visit | Attending: Internal Medicine | Admitting: Internal Medicine

## 2014-02-08 ENCOUNTER — Other Ambulatory Visit: Payer: Self-pay | Admitting: Internal Medicine

## 2014-02-08 DIAGNOSIS — N6321 Unspecified lump in the left breast, upper outer quadrant: Secondary | ICD-10-CM

## 2014-02-14 ENCOUNTER — Encounter: Payer: Self-pay | Admitting: Medical

## 2014-02-14 ENCOUNTER — Ambulatory Visit (INDEPENDENT_AMBULATORY_CARE_PROVIDER_SITE_OTHER): Payer: 59 | Admitting: Medical

## 2014-02-14 VITALS — BP 126/70 | HR 60 | Temp 97.6°F | Resp 16 | Ht 63.0 in | Wt 142.0 lb

## 2014-02-14 DIAGNOSIS — K5900 Constipation, unspecified: Secondary | ICD-10-CM

## 2014-02-14 DIAGNOSIS — K921 Melena: Secondary | ICD-10-CM

## 2014-02-14 MED ORDER — LUBIPROSTONE 8 MCG PO CAPS: 8.0000 ug | ORAL_CAPSULE | Freq: Two times a day (BID) | ORAL | Status: DC

## 2014-02-14 NOTE — Progress Notes (Signed)
   Subjective:   Susan Drake is a 33 y.o. female presenting on 02/14/2014 with changes in stool  New patient today.  Was going to [facility] prior.  Mom was diagnosed with cancer last year, and she had missed appt with [facility].  Wanted to go somewhere different.    Here for concerns of blood in stool.  Has had 3 episodes of blood in stool since last march.  Late march had noticed some small amount bright red blood on tissue after wiping.   On a different day few days later saw blood stool mixed in, bright red.    Few more days later had a loose stool with blood mixed in.     Has 3-4 BM weekly, usually formed stool.  Sometime gets some bloating and gas, but not bad.  Typically if she gets bloated, the day before BM.  No hx/o hemorrhoids.  No strainging, not pushing hard, sits on toilet for 5 minutes or less.  No abdominal pain.   Has had some mild intermittent back pain during times.  No prior problems with blood in stool.   No nausea, vomiting, no changes in appetite.  No family hx/o colon cancer.   She does take NSAIDs once monthly with cycle.  May have been on cycle just before the blood in stool episodes.   Had not used any pepto bismol.  Had spaghetti the day before the blood in the stool, no beets or other strong colored foods.  She says water and fiber intake is not so good.  No other aggravating or relieving factors.  No other complaint.  Review of Systems ROS as in subjective      Objective:     Filed Vitals:   02/14/14 1024  BP: 126/70  Pulse: 60  Temp: 97.6 F (36.4 C)  Resp: 16    General appearance: alert, no distress, WD/WN Neck: supple, no lymphadenopathy, no thyromegaly, no masses Heart: RRR, normal S1, S2, no murmurs Lungs: CTA bilaterally, no wheezes, rhonchi, or rales Abdomen: +bs, soft, non tender, non distended, no masses, no hepatomegaly, no splenomegaly Pulses: 2+ symmetric, upper and lower extremities, normal cap refill Ext: no  edema DRE: anus normal, no hemorrhoids or fissure, +internal hemorrhoid palpated, occult negative stool, exam chaperoned by nurse      Assessment: Encounter Diagnoses  Name Primary?  Marland Kitchen Unspecified constipation Yes  . Blood in stool      Plan: Discussed possible causes of blood in stool but likely due to internal hemorrhoids and constipation.  Increase water and fiber intake, begin Amitiza, recheck in 60mo.     Susan Drake was seen today for changes in stool.  Diagnoses and associated orders for this visit:  Unspecified constipation  Blood in stool  Other Orders - lubiprostone (AMITIZA) 8 MCG capsule; Take 1 capsule (8 mcg total) by mouth 2 (two) times daily with a meal.    Return pending labs, 1 mo.

## 2014-02-14 NOTE — Patient Instructions (Signed)
Constipation in Adults Constipation is having fewer than 2 bowel movements per week. Usually, the stools are hard. As we grow older, constipation is more common. If you try to fix constipation with laxatives, the problem may get worse. This is because laxatives taken over a long period of time make the colon muscles weaker. A low-fiber diet, not taking in enough fluids, and taking some medicines may make these problems worse. MEDICATIONS THAT MAY CAUSE CONSTIPATION  Water pills (diuretics).   Calcium channel blockers (used to control blood pressure and for the heart).   Certain pain medicines (narcotics).   Anticholinergics.   Anti-inflammatory agents.   Antacids that contain aluminum.  DISEASES THAT CONTRIBUTE TO CONSTIPATION  Diabetes.   Parkinson's disease.   Dementia.   Stroke.   Depression.   Illnesses that cause problems with salt and water metabolism.  HOME CARE INSTRUCTIONS   Constipation is usually best cared for without medicines. Increasing dietary fiber and eating more fruits and vegetables is the best way to manage constipation.   Slowly increase fiber intake to 25 to 38 grams per day. Whole grains, fruits, vegetables, and legumes are good sources of fiber. A dietitian can further help you incorporate high-fiber foods into your diet.   Drink enough water and fluids to keep your urine clear or pale yellow.   A fiber supplement may be added to your diet if you cannot get enough fiber from foods.   Increasing your activities also helps improve regularity.   Suppositories, as suggested by your caregiver, will also help. If you are using antacids, such as aluminum or calcium containing products, it will be helpful to switch to products containing magnesium if your caregiver says it is okay.   If you have been given a liquid injection (enema) today, this is only a temporary measure. It should not be relied on for treatment of longstanding (chronic) constipation.    Stronger measures, such as magnesium sulfate, should be avoided if possible. This may cause uncontrollable diarrhea. Using magnesium sulfate may not allow you time to make it to the bathroom.  SEEK IMMEDIATE MEDICAL CARE IF:   There is bright red blood in the stool.   The constipation stays for more than 4 days.   There is belly (abdominal) or rectal pain.   You do not seem to be getting better.   You have any questions or concerns.  MAKE SURE YOU:   Understand these instructions.   Will watch your condition.   Will get help right away if you are not doing well or get worse.  Document Released: 07/16/2004 Document Revised: 06/30/2011 Document Reviewed: 06/05/2008 [facility] Patient Information 2012 [place].   Bloody Stools Bloody stools often mean that there is a problem in the digestive tract. Your caregiver may use the term "melena" to describe black, tarry, and bad smelling stools or "hematochezia" to describe red or maroon-colored stools. Blood seen in the stool can be caused by bleeding anywhere along the intestinal tract.  A black stool usually means that blood is coming from the upper part of the gastrointestinal tract (esophagus, stomach, or small bowel). Passing maroon-colored stools or bright red blood usually means that blood is coming from lower down in the large bowel or the rectum. However, sometimes massive bleeding in the stomach or small intestine can cause bright red bloody stools.  Consuming black licorice, lead, iron pills, medicines containing bismuth subsalicylate, or blueberries can also cause black stools. Your caregiver can test black stools to  see if blood is present. It is important that the cause of the bleeding be found. Treatment can then be started, and the problem can be corrected. Rectal bleeding may not be serious, but you should not assume everything is okay until you know the cause.It is very important to follow up with your caregiver or a  specialist in gastrointestinal problems. CAUSES  Blood in the stools can come from various underlying causes.Often, the cause is not found during your first visit. Testing is often needed to discover the cause of bleeding in the gastrointestinal tract. Causes range from simple to serious or even life-threatening.Possible causes include:  Hemorrhoids.These are veins that are full of blood (engorged) in the rectum. They cause pain, inflammation, and may bleed.  Anal fissures.These are areas of painful tearing which may bleed. They are often caused by passing hard stool.  Diverticulosis.These are pouches that form on the colon over time, with age, and may bleed significantly.  Diverticulitis.This is inflammation in areas with diverticulosis. It can cause pain, fever, and bloody stools, although bleeding is rare.  Proctitis and colitis. These are inflamed areas of the rectum or colon. They may cause pain, fever, and bloody stools.  Polyps and cancer. Colon cancer is a leading cause of preventable cancer death.It often starts out as precancerous polyps that can be removed during a colonoscopy, preventing progression into cancer. Sometimes, polyps and cancer may cause rectal bleeding.  Gastritis and ulcers.Bleeding from the upper gastrointestinal tract (near the stomach) may travel through the intestines and produce black, sometimes tarry, often bad smelling stools. In certain cases, if the bleeding is fast enough, the stools may not be black, but red and the condition may be life-threatening. SYMPTOMS  You may have stools that are bright red and bloody, that are normal color with blood on them, or that are dark black and tarry. In some cases, you may only have blood in the toilet bowl. Any of these cases need medical care. You may also have:  Pain at the anus or anywhere in the rectum.  Lightheadedness or feeling faint.  Extreme weakness.  Nausea or vomiting.  Fever. DIAGNOSIS Your  caregiver may use the following methods to find the cause of your bleeding:  Taking a medical history. Age is important. Older people tend to develop polyps and cancer more often. If there is anal pain and a hard, large stool associated with bleeding, a tear of the anus may be the cause. If blood drips into the toilet after a bowel movement, bleeding hemorrhoids may be the problem. The color and frequency of the bleeding are additional considerations. In most cases, the medical history provides clues, but seldom the final answer.  A visual and finger (digital) exam. Your caregiver will inspect the anal area, looking for tears and hemorrhoids. A finger exam can provide information when there is tenderness or a growth inside. In men, the prostate is also examined.  Endoscopy. Several types of small, long scopes (endoscopes) are used to view the colon.  In the office, your caregiver may use a rigid, or more commonly, a flexible viewing sigmoidoscope. This exam is called flexible sigmoidoscopy. It is performed in 5 to 10 minutes.  A more thorough exam is accomplished with a colonoscope. It allows your caregiver to view the entire 5 to 6 foot long colon. Medicine to help you relax (sedative) is usually given for this exam. Frequently, a bleeding lesion may be present beyond the reach of the sigmoidoscope. So, a colonoscopy  may be the best exam to start with. Both exams are usually done on an outpatient basis. This means the patient does not stay overnight in the hospital or surgery center.  An upper endoscopy may be needed to examine your stomach. Sedation is used and a flexible endoscope is put in your mouth, down to your stomach.  A barium enema X-ray. This is an X-ray exam. It uses liquid barium inserted by enema into the rectum. This test alone may not identify an actual bleeding point. X-rays highlight abnormal shadows, such as those made by lumps (tumors), diverticuli, or colitis. TREATMENT    Treatment depends on the cause of your bleeding.   For bleeding from the stomach or colon, the caregiver doing your endoscopy or colonoscopy may be able to stop the bleeding as part of the procedure.  Inflammation or infection of the colon can be treated with medicines.  Many rectal problems can be treated with creams, suppositories, or warm baths.  Surgery is sometimes needed.  Blood transfusions are sometimes needed if you have lost a lot of blood.  For any bleeding problem, let your caregiver know if you take aspirin or other blood thinners regularly. HOME CARE INSTRUCTIONS   Take any medicines exactly as prescribed.  Keep your stools soft by eating a diet high in fiber. Prunes (1 to 3 a day) work well for many people.  Drink enough water and fluids to keep your urine clear or pale yellow.  Take sitz baths if advised. A sitz bath is when you sit in a bathtub with warm water for 10 to 15 minutes to soak, soothe, and cleanse the rectal area.  If enemas or suppositories are advised, be sure you know how to use them. Tell your caregiver if you have problems with this.  Monitor your bowel movements to look for signs of improvement or worsening. SEEK MEDICAL CARE IF:   You do not improve in the time expected.  Your condition worsens after initial improvement.  You develop any new symptoms. SEEK IMMEDIATE MEDICAL CARE IF:   You develop severe or prolonged rectal bleeding.  You vomit blood.  You feel weak or faint.  You have a fever. MAKE SURE YOU:  Understand these instructions.  Will watch your condition.  Will get help right away if you are not doing well or get worse. Document Released: 10/08/2002 Document Revised: 01/10/2012 Document Reviewed: 03/05/2011 [facility] Patient Information 2014 [place], [place].

## 2014-02-22 ENCOUNTER — Telehealth: Payer: Self-pay | Admitting: Internal Medicine

## 2014-02-22 NOTE — Telephone Encounter (Signed)
Medical records received from [facility]

## 2014-03-18 ENCOUNTER — Ambulatory Visit: Payer: Self-pay | Admitting: Medical

## 2014-03-19 ENCOUNTER — Encounter: Payer: Self-pay | Admitting: Family Medicine

## 2014-05-20 ENCOUNTER — Encounter: Payer: Self-pay | Admitting: Medical

## 2014-05-20 ENCOUNTER — Ambulatory Visit (INDEPENDENT_AMBULATORY_CARE_PROVIDER_SITE_OTHER): Payer: 59 | Admitting: Medical

## 2014-05-20 VITALS — BP 122/80 | HR 60 | Temp 98.1°F | Resp 16 | Wt 144.0 lb

## 2014-05-20 DIAGNOSIS — L089 Local infection of the skin and subcutaneous tissue, unspecified: Secondary | ICD-10-CM

## 2014-05-20 DIAGNOSIS — K5900 Constipation, unspecified: Secondary | ICD-10-CM

## 2014-05-20 DIAGNOSIS — K921 Melena: Secondary | ICD-10-CM

## 2014-05-20 MED ORDER — CEPHALEXIN 500 MG PO CAPS: 500.0000 mg | ORAL_CAPSULE | Freq: Three times a day (TID) | ORAL | Status: DC

## 2014-05-20 NOTE — Progress Notes (Signed)
   Subjective:   Susan Drake is a 33 y.o. female presenting on 05/20/2014 with Follow-up and concerned about a hair bump she has but she is on her cycle  Here for recheck.  Last visit was new patient with constipation and blood in stool.  Since last visit has BMs most days of the week, not really bothered now by bloating, pain, hard stool, and only had blood in stool that one episode back in April.  amitiza has been real helpful.  Hydrating fine, eating plenty of fiber.   Has new c/o skin bump on left vaginal area.  Started 4 days ago as a small bump, had gotten large and red and swelling up all labia majora. Today is quite a bit better but still kind of big and tender  No other aggravating or relieving factors.  No other complaint.  Review of Systems ROS as in subjective      Objective:   BP 122/80  Pulse 60  Temp(Src) 98.1 F (36.7 C) (Oral)  Resp 16  Wt 144 lb (65.318 kg)  General appearance: alert, no distress, WD/WN Abdomen: +bs, soft, non tender, non distended, no masses, no hepatomegaly, no splenomegaly Vulva: left vulvar adjacent to labia major with raised tender 1.5 cm diameter cyst like lesion suggestive of resolving cellulitis vs infected sebaceous cyst, central pore located, no drainage currently, no warmth, no erythema     Assessment: Encounter Diagnoses  Name Primary?  Marland Kitchen Unspecified constipation Yes  . Skin infection   . Blood in stool      Plan: Constipation - much improved on Amitza, c/t good fiber and water intake.  Skin infection - likely was more of a cellulitis but trying to resolve.  Begin kelfex, warm compresses, discussed hygiene, not reusing razors.  Recheck if not improving in 7-10days, sooner if worse.  discussed possibility of I&D if not improving.  Blood in stool - resolved, was likely due to internal hemorrhoid back in April.       Susan Drake was seen today for follow-up and concerned about a hair bump she has but she is on her cycle.  Diagnoses  and associated orders for this visit:  Unspecified constipation  Skin infection  Blood in stool  Other Orders - cephALEXin (KEFLEX) 500 MG capsule; Take 1 capsule (500 mg total) by mouth 3 (three) times daily.    Return for physical.

## 2014-05-20 NOTE — Patient Instructions (Signed)
  Thank you for giving me the opportunity to serve you today.    Your diagnosis today includes: Encounter Diagnoses  Name Primary?  Marland Kitchen Unspecified constipation Yes  . Skin infection      Specific recommendations today include:  Continue Amitiza twice daily for constipation  Continue good hydration, good fiber intake daily  For the localized skin infection/sebaceous cyst, begin Keflex antibiotic 3 times a day along with warm compresses  If this doesn't seem to be clearing up within 7-10 days let me know

## 2014-09-25 ENCOUNTER — Other Ambulatory Visit: Payer: Self-pay | Admitting: Medical

## 2014-09-25 DIAGNOSIS — N6320 Unspecified lump in the left breast, unspecified quadrant: Secondary | ICD-10-CM

## 2014-10-01 ENCOUNTER — Encounter: Payer: Self-pay | Admitting: Medical

## 2014-10-01 ENCOUNTER — Ambulatory Visit (INDEPENDENT_AMBULATORY_CARE_PROVIDER_SITE_OTHER): Payer: 59 | Admitting: Medical

## 2014-10-01 VITALS — BP 130/80 | HR 73 | Temp 98.1°F | Resp 16 | Wt 149.0 lb

## 2014-10-01 DIAGNOSIS — R140 Abdominal distension (gaseous): Secondary | ICD-10-CM

## 2014-10-01 DIAGNOSIS — K5900 Constipation, unspecified: Secondary | ICD-10-CM

## 2014-10-01 DIAGNOSIS — R194 Change in bowel habit: Secondary | ICD-10-CM

## 2014-10-01 LAB — COMPREHENSIVE METABOLIC PANEL
ALBUMIN: 4.3 g/dL (ref 3.5–5.2)
ALT: 12 U/L (ref 0–35)
AST: 15 U/L (ref 0–37)
Alkaline Phosphatase: 33 U/L — ABNORMAL LOW (ref 39–117)
BILIRUBIN TOTAL: 0.7 mg/dL (ref 0.2–1.2)
BUN: 13 mg/dL (ref 6–23)
CO2: 26 meq/L (ref 19–32)
Calcium: 8.8 mg/dL (ref 8.4–10.5)
Chloride: 105 mEq/L (ref 96–112)
Creat: 0.78 mg/dL (ref 0.50–1.10)
GLUCOSE: 77 mg/dL (ref 70–99)
Potassium: 4.2 mEq/L (ref 3.5–5.3)
SODIUM: 138 meq/L (ref 135–145)
TOTAL PROTEIN: 6.9 g/dL (ref 6.0–8.3)

## 2014-10-01 LAB — CBC WITH DIFFERENTIAL/PLATELET
Basophils Absolute: 0 10*3/uL (ref 0.0–0.1)
Basophils Relative: 0 % (ref 0–1)
EOS ABS: 0.1 10*3/uL (ref 0.0–0.7)
Eosinophils Relative: 1 % (ref 0–5)
HEMATOCRIT: 38.5 % (ref 36.0–46.0)
HEMOGLOBIN: 12.2 g/dL (ref 12.0–15.0)
LYMPHS ABS: 1.9 10*3/uL (ref 0.7–4.0)
Lymphocytes Relative: 30 % (ref 12–46)
MCH: 25.7 pg — AB (ref 26.0–34.0)
MCHC: 31.7 g/dL (ref 30.0–36.0)
MCV: 81.2 fL (ref 78.0–100.0)
MONO ABS: 0.5 10*3/uL (ref 0.1–1.0)
MONOS PCT: 8 % (ref 3–12)
NEUTROS PCT: 61 % (ref 43–77)
Neutro Abs: 3.8 10*3/uL (ref 1.7–7.7)
Platelets: 180 10*3/uL (ref 150–400)
RBC: 4.74 MIL/uL (ref 3.87–5.11)
RDW: 14.6 % (ref 11.5–15.5)
WBC: 6.2 10*3/uL (ref 4.0–10.5)

## 2014-10-01 LAB — TSH: TSH: 1.265 u[IU]/mL (ref 0.350–4.500)

## 2014-10-01 NOTE — Progress Notes (Addendum)
Subjective: Here for decreased BMs, changes in BMs.  Was on amitiza since 01/2014 after hemorrhoid,  BMs did improve.  I saw her back in July and she was continuing to do well on an Amitiza for constipation. She went on a vacation to the [place] in September in a few weeks after returning started having some changes in her bowel movements, and for the last month and a half been having intermittent loose stools, cramping, bloating, at times feels little lightheaded, fatigue. Sometimes abdominal cramping lately only having a bowel movement every 3 days. Denies any additional blood in the stool or hemorrhoids. She does note history of heavy periods, history of fibroids, sees gynecology in January.  Denies fevers, night sweats, weight loss, no hot or cold intolerance skin or hair changes. No other aggravating or relieving factor. No other complaint.   Review of systems as in subjective  Objective: Filed Vitals:   10/01/14 1117  BP: 130/80  Pulse: 73  Temp: 98.1 F (36.7 C)  Resp: 16    General appearance: alert, no distress, WD/WN Oral cavity: MMM, no lesions Neck: supple, no lymphadenopathy, no thyromegaly, no masses Heart: RRR, normal S1, S2, no murmurs Lungs: CTA bilaterally, no wheezes, rhonchi, or rales Abdomen: +bs, soft, several port surgical scars, low transverse suprapubic scar, non tender, non distended, no masses, no hepatomegaly, no splenomegaly Pulses: 2+ symmetric, upper and lower extremities, normal cap refill   Assessment: Encounter Diagnoses  Name Primary?  . Constipation, unspecified constipation type Yes  . Bowel habit changes   . Bloating    Plan: Labs today, continue Amitiza 8 g twice daily, reiterated the need for 64+ ounces water daily, 25 g of fiber daily.  Follow-up pending labs

## 2014-10-09 ENCOUNTER — Other Ambulatory Visit: Payer: Self-pay | Admitting: Medical

## 2014-10-09 ENCOUNTER — Other Ambulatory Visit: Payer: Self-pay | Admitting: Family Medicine

## 2014-10-09 ENCOUNTER — Other Ambulatory Visit: Payer: Self-pay

## 2014-10-09 DIAGNOSIS — N6320 Unspecified lump in the left breast, unspecified quadrant: Secondary | ICD-10-CM

## 2014-10-09 DIAGNOSIS — K5909 Other constipation: Secondary | ICD-10-CM

## 2014-10-09 DIAGNOSIS — R140 Abdominal distension (gaseous): Secondary | ICD-10-CM

## 2014-10-10 ENCOUNTER — Ambulatory Visit
Admission: RE | Admit: 2014-10-10 | Discharge: 2014-10-10 | Disposition: A | Discharge status: Home / Self Care | Payer: 59 | Source: Ambulatory Visit | Attending: Medical | Admitting: Medical

## 2014-10-10 ENCOUNTER — Ambulatory Visit: Payer: 59

## 2014-10-10 DIAGNOSIS — N6320 Unspecified lump in the left breast, unspecified quadrant: Secondary | ICD-10-CM

## 2014-10-10 NOTE — Progress Notes (Signed)
LM to CB

## 2014-10-17 NOTE — Progress Notes (Signed)
Reported to patient. 

## 2014-11-15 ENCOUNTER — Other Ambulatory Visit: Payer: Self-pay | Admitting: Emergency Medicine

## 2014-11-18 LAB — MEASLES/MUMPS/RUBELLA IMMUNITY
MUMPS IGG: 67.1 [AU]/ml — AB (ref <9.00)
Rubella: 5.29 Index — ABNORMAL HIGH (ref <0.90)
Rubeola IgG: 56.8 AU/mL — ABNORMAL HIGH (ref <25.00)

## 2014-11-18 LAB — VARICELLA ZOSTER ANTIBODY, IGG: Varicella IgG: >4000 Index — ABNORMAL HIGH (ref <135.00)

## 2014-12-05 ENCOUNTER — Telehealth: Payer: Self-pay | Admitting: Internal Medicine

## 2014-12-05 NOTE — Telephone Encounter (Signed)
She can come in for consult for biometric form if due by end of month.   Its too bad the timing doesn't work out as her last physical prior to coming here was 12/2013.  Double check if she is with same insurance as 12/2013.  If new insurance since 12/2013, then we could go ahead and do a complete physical.

## 2014-12-05 NOTE — Telephone Encounter (Signed)
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 She had a physical back on 01/28/14 and we have the records but we do not have the labs done for it. So pt is needing her bp, height weight, and some blood work done. Can the patient just come in for a consult with you to get this done since she can not have a physical yet. She has to have this done by the end of the month. Call pt @ [phone]

## 2014-12-05 NOTE — Telephone Encounter (Signed)
Spoke to pt. She is pretty sure she had the same insurance at her last physical as now. She has scheduled a consult and will come in fasting

## 2014-12-11 ENCOUNTER — Encounter: Payer: Self-pay | Admitting: Medical

## 2014-12-11 ENCOUNTER — Ambulatory Visit (INDEPENDENT_AMBULATORY_CARE_PROVIDER_SITE_OTHER): Payer: 59 | Admitting: Medical

## 2014-12-11 VITALS — BP 110/80 | HR 72 | Temp 98.2°F | Resp 16 | Ht 63.0 in | Wt 148.0 lb

## 2014-12-11 DIAGNOSIS — Z720 Tobacco use: Secondary | ICD-10-CM

## 2014-12-11 DIAGNOSIS — F17200 Nicotine dependence, unspecified, uncomplicated: Secondary | ICD-10-CM

## 2014-12-11 DIAGNOSIS — K5900 Constipation, unspecified: Secondary | ICD-10-CM

## 2014-12-11 DIAGNOSIS — R1084 Generalized abdominal pain: Secondary | ICD-10-CM

## 2014-12-11 NOTE — Progress Notes (Signed)
   Subjective:   Susan Drake is a 34 y.o. female presenting on 12/11/2014 with fill out biometric forms  Has biometric for work needs completing including fasting labs.    Abdominal pain and constipation improved for the time being . Saw GI Dr. Collene Mares for similar, but not taking Linzess now.  Sees gynecology, pap up to date, has hx/o fibroids.  No other aggravating or relieving factors.  No other complaint.  Review of Systems ROS as in subjective      Objective:     Filed Vitals:   12/11/14 1422  BP: 110/80  Pulse: 72  Temp: 98.2 F (36.8 C)  Resp: 16    General appearance: alert, no distress, WD/WN Neck: supple, no lymphadenopathy, no thyromegaly, no masses Heart: RRR, normal S1, S2, no murmurs Lungs: CTA bilaterally, no wheezes, rhonchi, or rales Abdomen: +bs, soft, non tender, non distended, no masses, no hepatomegaly, no splenomegaly Pulses: 2+ symmetric, upper and lower extremities, normal cap refill      Assessment: Encounter Diagnoses  Name Primary?  . Generalized abdominal pain Yes  . Constipation, unspecified constipation type   . Smoker      Plan: Completed biometric form today, advised she completely stop tobacco.  Return for fasting labs.   Mikaia was seen today for fill out biometric forms.  Diagnoses and all orders for this visit:  Generalized abdominal pain Orders: -     Lipid panel; Future -     Comprehensive metabolic panel; Future  Constipation, unspecified constipation type Orders: -     Lipid panel; Future -     Comprehensive metabolic panel; Future  Smoker Orders: -     Lipid panel; Future -     Comprehensive metabolic panel; Future   Return pending labs.

## 2014-12-13 ENCOUNTER — Other Ambulatory Visit: Payer: 59

## 2014-12-13 DIAGNOSIS — K5900 Constipation, unspecified: Secondary | ICD-10-CM

## 2014-12-13 DIAGNOSIS — R1084 Generalized abdominal pain: Secondary | ICD-10-CM

## 2014-12-13 DIAGNOSIS — F17200 Nicotine dependence, unspecified, uncomplicated: Secondary | ICD-10-CM

## 2014-12-14 LAB — COMPREHENSIVE METABOLIC PANEL
ALBUMIN: 4 g/dL (ref 3.5–5.2)
ALK PHOS: 29 U/L — AB (ref 39–117)
ALT: 11 U/L (ref 0–35)
AST: 19 U/L (ref 0–37)
BILIRUBIN TOTAL: 0.6 mg/dL (ref 0.2–1.2)
BUN: 11 mg/dL (ref 6–23)
CALCIUM: 8.7 mg/dL (ref 8.4–10.5)
CO2: 25 meq/L (ref 19–32)
CREATININE: 0.75 mg/dL (ref 0.50–1.10)
Chloride: 104 mEq/L (ref 96–112)
Glucose, Bld: 75 mg/dL (ref 70–99)
Potassium: 4.7 mEq/L (ref 3.5–5.3)
Sodium: 138 mEq/L (ref 135–145)
TOTAL PROTEIN: 6.5 g/dL (ref 6.0–8.3)

## 2014-12-14 LAB — LIPID PANEL
Cholesterol: 151 mg/dL (ref 0–200)
HDL: 51 mg/dL (ref >39)
LDL Cholesterol: 90 mg/dL (ref 0–99)
TRIGLYCERIDES: 49 mg/dL (ref <150)
Total CHOL/HDL Ratio: 3 Ratio
VLDL: 10 mg/dL (ref 0–40)

## 2015-04-03 ENCOUNTER — Encounter: Payer: Self-pay | Admitting: Medical

## 2015-04-03 ENCOUNTER — Ambulatory Visit (INDEPENDENT_AMBULATORY_CARE_PROVIDER_SITE_OTHER): Payer: 59 | Admitting: Medical

## 2015-04-03 VITALS — BP 120/70 | HR 68 | Temp 98.1°F | Resp 16 | Ht 63.0 in | Wt 146.0 lb

## 2015-04-03 DIAGNOSIS — K5900 Constipation, unspecified: Secondary | ICD-10-CM | POA: Diagnosis not present

## 2015-04-03 DIAGNOSIS — Z113 Encounter for screening for infections with a predominantly sexual mode of transmission: Secondary | ICD-10-CM

## 2015-04-03 DIAGNOSIS — R1031 Right lower quadrant pain: Secondary | ICD-10-CM

## 2015-04-03 DIAGNOSIS — K5909 Other constipation: Secondary | ICD-10-CM

## 2015-04-03 DIAGNOSIS — Z0000 Encounter for general adult medical examination without abnormal findings: Secondary | ICD-10-CM

## 2015-04-03 DIAGNOSIS — R109 Unspecified abdominal pain: Secondary | ICD-10-CM | POA: Diagnosis not present

## 2015-04-03 LAB — POCT URINALYSIS DIPSTICK
Bilirubin, UA: NEGATIVE
Blood, UA: NEGATIVE
GLUCOSE UA: NEGATIVE
KETONES UA: NEGATIVE
Leukocytes, UA: NEGATIVE
Nitrite, UA: NEGATIVE
Protein, UA: NEGATIVE
Spec Grav, UA: 1.025
Urobilinogen, UA: NEGATIVE
pH, UA: 6.5

## 2015-04-03 LAB — CBC WITH DIFFERENTIAL/PLATELET
Basophils Absolute: 0 10*3/uL (ref 0.0–0.1)
Basophils Relative: 0 % (ref 0–1)
EOS ABS: 0.1 10*3/uL (ref 0.0–0.7)
Eosinophils Relative: 2 % (ref 0–5)
HCT: 37.4 % (ref 36.0–46.0)
Hemoglobin: 11.9 g/dL — ABNORMAL LOW (ref 12.0–15.0)
LYMPHS ABS: 1.3 10*3/uL (ref 0.7–4.0)
Lymphocytes Relative: 23 % (ref 12–46)
MCH: 26.3 pg (ref 26.0–34.0)
MCHC: 31.8 g/dL (ref 30.0–36.0)
MCV: 82.6 fL (ref 78.0–100.0)
MONO ABS: 0.7 10*3/uL (ref 0.1–1.0)
MPV: 13.1 fL — AB (ref 8.6–12.4)
Monocytes Relative: 12 % (ref 3–12)
Neutro Abs: 3.7 10*3/uL (ref 1.7–7.7)
Neutrophils Relative %: 63 % (ref 43–77)
Platelets: 173 10*3/uL (ref 150–400)
RBC: 4.53 MIL/uL (ref 3.87–5.11)
RDW: 13.6 % (ref 11.5–15.5)
WBC: 5.8 10*3/uL (ref 4.0–10.5)

## 2015-04-03 MED ORDER — LEVOCETIRIZINE DIHYDROCHLORIDE 5 MG PO TABS: 5.0000 mg | ORAL_TABLET | Freq: Every evening | ORAL | Status: DC

## 2015-04-03 MED ORDER — FLUTICASONE PROPIONATE 50 MCG/ACT NA SUSP: 2.0000 | Freq: Every day | NASAL | Status: DC

## 2015-04-03 NOTE — Progress Notes (Signed)
Subjective:   HPI  Susan Drake is a 34 y.o. female who presents for a complete physical.  Medical care team/other doctors includes:  Dr Garwin Brothers 2015 pap and pelvic  Dr Allyson Sabal dermatology   Preventative care: Last physical or labs: 01/2014 [facility] Sees dentist yearly: Yes Dr Ladona Horns Last tetanus vaccine, TD or Tdap: current   Gynecology history: Pregnancy history no pregnancies Contraception currently non LMP 03/19/15 Relationship status: married Concern for STD No, last STD testing 2015, would like HIV drawn today   Concerns: abd pain/right inguinal pain, sometimes low back pain x few months.   Gets mainly rolling over in bed or at random times. Not worse with activity. No recent trauma or injury or fall.   No new GI, GU, or gyn symptoms.   Probiotic is helping constipations issues  Reviewed their medical, surgical, family, social, medication, and allergy history and updated chart as appropriate.  Past Medical History  Diagnosis Date  . Fibroid     Dr. Garwin Brothers  . Constipation   . Seasonal allergic rhinitis   . Uterine fibroid   . Dermatological disease     sees Dr. Allyson Sabal, bilat upper arms and chest  . History of chlamydia     hx/o trichomonas and chlamydia in remote past    Past Surgical History  Procedure Laterality Date  . Wisdom tooth extraction  2011  . Robot assisted myomectomy  05/03/2012    Procedure: ROBOTIC ASSISTED MYOMECTOMY;  Surgeon: Marvene Staff, MD;  Location: [facility];  Service: Gynecology;  Laterality: N/A;  Times two fibroids with robot.  . Myomectomy  05/03/2012    Procedure: MYOMECTOMY;  Surgeon: Marvene Staff, MD;  Location: [facility];  Service: Gynecology;  Laterality: N/A;   Exploratory Laparotomy myomectomy. Open at 1410.    History   Social History  . Marital Status: Married    Spouse Name: N/A  . Number of Children: N/A  . Years of Education: N/A   Occupational History  . Not on file.   Social History Main  Topics  . Smoking status: Current Some Day Smoker    Types: Cigars  . Smokeless tobacco: Not on file     Comment: cigars occaionally  . Alcohol Use: Yes     Comment: social  . Drug Use: No  . Sexual Activity: Not on file   Other Topics Concern  . Not on file   Social History Narrative   Married 7 years, no children, exercise - some with treadmill, cardio, goes to gym, works at [employer]    Family History  Problem Relation Age of Onset  . Cancer Mother     non hodgkins lymphoma  . Appendicitis Mother   . COPD Father     emphysema  . Hypertension Father   . Eczema Father   . Cancer Maternal Uncle     liver  . Heart disease Neg Hx   . Stroke Neg Hx   . Diabetes Neg Hx   . Kidney disease Sister   . Hypertension Cousin   . Cerebral palsy Sister   . Fibroids Sister   . Arthritis Brother      Current outpatient prescriptions:  .  ibuprofen (ADVIL,MOTRIN) 200 MG tablet, Take 800 mg by mouth 2 (two) times daily as needed. For menstrual cramps, Disp: , Rfl:  .  levocetirizine (XYZAL) 5 MG tablet, Take 1 tablet (5 mg total) by mouth every evening., Disp: 90 tablet, Rfl: 3 .  Probiotic Product (PROBIOTIC DAILY PO), Take by mouth., Disp: , Rfl:  .  fluticasone (FLONASE) 50 MCG/ACT nasal spray, Place 2 sprays into both nostrils daily., Disp: 16 g, Rfl: 11 .  Multiple Vitamins-Minerals (MULTIVITAMIN WITH MINERALS) tablet, Take 1 tablet by mouth daily., Disp: , Rfl:   No Known Allergies  Review of Systems Constitutional: -fever, -chills, -sweats, -unexpected weight change, -decreased appetite, -fatigue Allergy: +sneezing, +itching, +congestion Dermatology: -changing moles, --rash, -lumps ENT: -runny nose, -ear pain, -sore throat, -hoarseness, -sinus pain, -teeth pain, - ringing in ears, -hearing loss, -nosebleeds Cardiology: -chest pain, -palpitations, -swelling, -difficulty breathing when lying flat, -waking up short of breath Respiratory: -cough, -shortness of breath,  -difficulty breathing with exercise or exertion, -wheezing, -coughing up blood Gastroenterology: +abdominal pain, -nausea, -vomiting, -diarrhea, -constipation, -blood in stool, -changes in bowel movement, -difficulty swallowing or eating Hematology: -bleeding, -bruising  Musculoskeletal: -joint aches, -muscle aches, -joint swelling, +back pain, -neck pain, -cramping, -changes in gait Ophthalmology: denies vision changes, eye redness, itching, discharge Urology: -burning with urination, -difficulty urinating, -blood in urine, -urinary frequency, -urgency, -incontinence Neurology: -headache, -weakness, -tingling, -numbness, -memory loss, -falls, -dizziness Psychology: -depressed mood, -agitation, -sleep problems     Objective:   Physical Exam  BP 120/70 mmHg  Pulse 68  Temp(Src) 98.1 F (36.7 C) (Oral)  Resp 16  Ht 5\' 3"  (1.6 m)  Wt 146 lb (66.225 kg)  BMI 25.87 kg/m2  LMP 03/19/2015  General appearance: alert, no distress, WD/WN, AA female Skin: scattered brown flat lesions of bilat shoulders and upper lateral chest suggestive or pityriasis rosea, clearing, no worrisome lesions otherwise  HEENT: normocephalic, conjunctiva/corneas normal, sclerae anicteric, PERRLA, EOMi, nares patent, no discharge or erythema, pharynx normal Oral cavity: MMM, tongue normal, teeth in good repair Neck: supple, no lymphadenopathy, no thyromegaly, no masses, normal ROM, no bruits Chest: non tender, normal shape and expansion Heart: RRR, normal S1, S2, no murmurs Lungs: CTA bilaterally, no wheezes, rhonchi, or rales Abdomen: +bs, soft, mild RLQ tenderness, non tender, non distended, no masses, no hepatomegaly, no splenomegaly, no bruits Back: mild right lumbar low back tenderness, otherwise non tender, normal ROM, no scoliosis Musculoskeletal: upper extremities non tender, no obvious deformity, normal ROM throughout, lower extremities non tender, no obvious deformity, normal ROM throughout Extremities: no  edema, no cyanosis, no clubbing Pulses: 2+ symmetric, upper and lower extremities, normal cap refill Neurological: alert, oriented x 3, CN2-12 intact, strength normal upper extremities and lower extremities, sensation normal throughout, DTRs 2+ throughout, no cerebellar signs, gait normal Psychiatric: normal affect, behavior normal, pleasant  Gyn/rectal/breast - deferred to gyn   Assessment and Plan :    Encounter Diagnoses  Name Primary?  . Encounter for health maintenance examination in adult Yes  . Flank pain   . Inguinal pain, right   . Screen for STD (sexually transmitted disease)   . Chronic constipation     Physical exam - discussed healthy lifestyle, diet, exercise, preventative care, vaccinations, and addressed their concerns.   See your dentist yearly for routine dental care including hygiene visits twice yearly. See your eye doctor yearly for routine vision care. See your gynecologist yearly for routine gynecological care. Routine labs today, reviewed recent labs from last visit Chronic constipation - reviewed GI notes from the past 12 months and she is doing well on probiotic currently.  Not on amitiza Flank and inguinal pain - she has see gyn recently and has pap this month.  The pain not thought to be gyn related.  May be musculoskeletal, no other obvious etiology.  advised daily stretching routine, regularly exercise, no heavy lifting for now and consider pillow to support legs in bed for now.  If not improving in the next 2-3 weeks, consider imaging of abdomen/pelvis.   She will get [place] copy of her vaccines . Refilled her allergy medication Follow up pending labs

## 2015-04-04 LAB — RPR

## 2015-04-04 LAB — HIV ANTIBODY (ROUTINE TESTING W REFLEX): HIV 1&2 Ab, 4th Generation: NONREACTIVE

## 2015-05-14 ENCOUNTER — Telehealth: Payer: Self-pay | Admitting: Internal Medicine

## 2015-05-14 NOTE — Telephone Encounter (Signed)
Faxed over medical records to [facility] @ [phone] on 05/14/15

## 2015-07-03 ENCOUNTER — Ambulatory Visit (INDEPENDENT_AMBULATORY_CARE_PROVIDER_SITE_OTHER): Payer: 59 | Admitting: Medical

## 2015-07-03 ENCOUNTER — Encounter: Payer: Self-pay | Admitting: Medical

## 2015-07-03 VITALS — BP 134/88 | HR 72 | Temp 98.1°F | Resp 15 | Wt 151.0 lb

## 2015-07-03 DIAGNOSIS — R519 Headache, unspecified: Secondary | ICD-10-CM

## 2015-07-03 DIAGNOSIS — R51 Headache: Secondary | ICD-10-CM | POA: Diagnosis not present

## 2015-07-03 MED ORDER — HYDROCODONE-ACETAMINOPHEN 5-325 MG PO TABS: 1.0000 | ORAL_TABLET | Freq: Four times a day (QID) | ORAL | Status: DC | PRN

## 2015-07-03 MED ORDER — AMOXICILLIN 875 MG PO TABS: 875.0000 mg | ORAL_TABLET | Freq: Two times a day (BID) | ORAL | Status: DC

## 2015-07-03 NOTE — Progress Notes (Signed)
   Subjective: Chief Complaint  Patient presents with  . pain in right side of cheek    sudden onset last night, woke up with pain/pressure  in right cheek from mouth to ear...swollen, teeth dont hurt but it feels good  to chew, no injury, denies sore throat   Here for pain from right jaw/gum all the way up to around right ear.  Awoke in the middle of the night with pain, took ibuprofen which helped some.  When she awoke the pain was still there.  Can eat on that side, and this makes it feel better.  Denies sinus pain, no congestion, no runny nose, no sneezing.  No fever.  No nausea or vomiting.  Had decreased appetite yesterday evening.  Does have pain in right ear.  No pain opening and closing mouth.   Skin feels warm but not tingly.  Pain is a throbbing tooth ache like pain.   Touching her right face feels funny.  No other aggravating or relieving factors. No other complaint.  Past Medical History  Diagnosis Date  . Fibroid     Dr. Garwin Brothers  . Constipation   . Seasonal allergic rhinitis   . Uterine fibroid   . Dermatological disease     sees Dr. Allyson Sabal, bilat upper arms and chest  . History of chlamydia     hx/o trichomonas and chlamydia in remote past   ROS as in subjective  Objective: BP 134/88 mmHg  Pulse 72  Temp(Src) 98.1 F (36.7 C) (Oral)  Resp 15  Wt 151 lb (68.493 kg)  LMP 07/01/2015  General appearance: alert, no distress, WD/WN HEENT: normocephalic, sclerae anicteric, TMs pearly, nares patent, no discharge or erythema, pharynx normal Oral cavity: MMM, no lesions Neck: supple, no lymphadenopathy, no thyromegaly, no masses No tenderness of TMJ, not particularly tender of face, no rash    Assessment: Encounter Diagnosis  Name Primary?  . Right facial pain Yes     Plan: Etiology unclear.   Possibly sinusitis or tooth infection, but not obvious on exam.  Exam is noncontributory.  disused possibility of shingles as well although no rash present currently.  Begin  amoxicillin, hydrocodone, rest, good hygiene.  Can c/t ibuprofen.   If new symptoms, particularly fever, rash, then call or recheck immediately.   Otherwise f/u with dentist as planned, and call back Monday with symptom update.

## 2015-07-04 ENCOUNTER — Other Ambulatory Visit: Payer: Self-pay | Admitting: Medical

## 2015-07-04 ENCOUNTER — Telehealth: Payer: Self-pay | Admitting: Medical

## 2015-07-04 MED ORDER — ONDANSETRON HCL 4 MG PO TABS: 4.0000 mg | ORAL_TABLET | Freq: Three times a day (TID) | ORAL | Status: DC | PRN

## 2015-07-04 NOTE — Telephone Encounter (Signed)
Pt called and started taking hydrocodone and said it was making her sick to her stomach didn't know if you could send her something in for the nausea pt uses [facility] and can be reached at [phone] if needed

## 2015-07-04 NOTE — Telephone Encounter (Signed)
zofran sent for nausea.   She can take 1/2 tablet hydrocodone if needed for pain, or can use OTC Ibuprfeon that she was doing prior if the hydrocodone makes her that sick.     Any new symptoms today, any better? Any rash?

## 2015-07-04 NOTE — Telephone Encounter (Signed)
i called pt and told her that the Zofran was sent in,pt said the soreness wasn't as bad, still a little tingly, but no rash, better than yesterday

## 2015-07-09 ENCOUNTER — Ambulatory Visit: Payer: 59 | Admitting: Family Medicine

## 2015-12-01 ENCOUNTER — Ambulatory Visit (INDEPENDENT_AMBULATORY_CARE_PROVIDER_SITE_OTHER): Payer: 59 | Admitting: Medical

## 2015-12-01 ENCOUNTER — Encounter: Payer: Self-pay | Admitting: Medical

## 2015-12-01 VITALS — BP 110/76 | HR 78 | Wt 149.0 lb

## 2015-12-01 DIAGNOSIS — M5441 Lumbago with sciatica, right side: Secondary | ICD-10-CM

## 2015-12-01 MED ORDER — TIZANIDINE HCL 4 MG PO TABS: 4.0000 mg | ORAL_TABLET | Freq: Every day | ORAL | Status: DC

## 2015-12-01 NOTE — Progress Notes (Signed)
Subjective: Chief Complaint  Patient presents with  . Back Pain    seen last year for this, but now it is back and she said she has a burning sensation and will radiate down her leg or up her back. she said that she can not bend over. reamains on rt side and sometimes will be on the left. asked about a kidney test?   Here for back pains.   Had similar last year.  Currently been having back pains for months, but more consistent in the last 62mo.  She notes not excruciating pain, but is often of late.  Gets burning sensation in low back, at night hurts when sleeping, sometimes feels warm to touch over right buttock.  Pain is in low back and radiates down her leg, typically right leg.   Certain chairs makes it worse, standing to do dishes hurts, household working bending over can worsen.  Gets tingling in leg.  Gets dull feeling some in front of right leg.  Denies incontinence.  Sometimes improves with bowel movements.  Most of the time doesn't take anything but sometimes uses mint tea or ibuprofen.  Exercise - treadmill or cycling.   Uses stretching about twice weekly.  Felt like the pain started when she starting carrying her work [employer].  Big book bags make the pain worse.   Denies xrays of back.   Denies specific injury or trauma.  No other aggravating or relieving factors. No other complaint.    Past Medical History  Diagnosis Date  . Fibroid     Dr. Garwin Brothers  . Constipation   . Seasonal allergic rhinitis   . Uterine fibroid   . Dermatological disease     sees Dr. Allyson Sabal, bilat upper arms and chest  . History of chlamydia     hx/o trichomonas and chlamydia in remote past   ROS as in subjective   Objective: BP 110/76 mmHg  Pulse 78  Wt 149 lb (67.586 kg)  SpO2 96%  LMP 11/06/2015 (Exact Date)  Gen: wd, wn, nad Skin: unremarkable MSK: legs nontender, normal ROM of legs, but gets pain in low right back with hip ROM.  No swelling Back: tender right lumbar paraspinal muscles, mild  pain with flexion and extension although ROM is full, otherwise back nontender Ext: no edema Pulses normal Neuro: legs normal Strength, DTRs, sensation and -SLR.    Assessment: Encounter Diagnosis  Name Primary?  . Right-sided low back pain with right-sided sciatica Yes     Plan: Discussed ways to work on body mechanics, stretching, maybe better arch support in shoes.   Refer to physical therapy, can use Aleve or Ibuprofen daily the next week or so, begin trial of Tizanidine QHS, discussed risks/benefits of medication, and referral to physical therapy.  F/u 4-6 wk

## 2016-01-21 ENCOUNTER — Ambulatory Visit (INDEPENDENT_AMBULATORY_CARE_PROVIDER_SITE_OTHER): Payer: 59 | Admitting: Family Medicine

## 2016-01-21 ENCOUNTER — Encounter: Payer: Self-pay | Admitting: Family Medicine

## 2016-01-21 VITALS — BP 130/80 | HR 64 | Temp 99.0°F | Wt 153.0 lb

## 2016-01-21 DIAGNOSIS — N751 Abscess of Bartholin's gland: Secondary | ICD-10-CM | POA: Diagnosis not present

## 2016-01-21 MED ORDER — SULFAMETHOXAZOLE-TRIMETHOPRIM 800-160 MG PO TABS: 1.0000 | ORAL_TABLET | Freq: Two times a day (BID) | ORAL | Status: DC

## 2016-01-21 NOTE — Patient Instructions (Signed)
Bartholin Cyst or Abscess A Bartholin cyst is a fluid-filled sac that forms on a Bartholin gland. Bartholin glands are small glands that are located within the folds of skin (labia) along the sides of the lower opening of the vagina. These glands produce a fluid to moisten the outside of the vagina during sexual intercourse. A Bartholin cyst causes a bulge on the side of the vagina. A cyst that is not large or infected may not cause symptoms or problems. However, if the fluid within the cyst becomes infected, the cyst can turn into an abscess. An abscess may cause discomfort or pain. CAUSES A Bartholin cyst may develop when the duct of the gland becomes blocked. In many cases, the cause of this is not known. Various kinds of bacteria can cause the cyst to become infected and develop into an abscess. RISK FACTORS You may be at an increased risk of developing a Bartholin cyst or abscess if:  You are a woman of reproductive age.  You have a history of previous Bartholin cysts or abscesses.  You have diabetes.  You have a sexually transmitted disease (STD). SIGNS AND SYMPTOMS The severity of symptoms varies depending on the size of the cyst and whether it is infected. Symptoms may include:  A bulge or swelling near the lower opening of your vagina.  Discomfort or pain.  Redness.  Pain during sexual intercourse.  Pain when walking.  Fluid draining from the area. DIAGNOSIS Your health care provider may make a diagnosis based on your symptoms and a physical exam. He or she will look for swelling in your vaginal area. Blood tests may be done to check for infections. A sample of fluid from the cyst or abscess may also be taken to be tested in a lab. TREATMENT Small cysts that are not infected may not require any treatment. These often go away on their own. Yourhealth care provider will recommend hot baths and the use of warm compresses. These may also be part of the treatment for an abscess.  Treatment options for a large cyst or abscess may include:   Antibiotic medicine.  A surgical procedure to drain the abscess. One of the following procedures may be done:  Incision and drainage. An incision is made in the cyst or abscess so that the fluid drains out. A catheter may be placed inside the cyst so that it does not close and fill up with fluid again. The catheter will be removed after you have a follow-up visit with a specialist (gynecologist).  Marsupialization. The cyst or abscess is opened and kept open by stitching the edges of the skin to the walls of the cyst or abscess. This allows it to continue to drain and not fill up with fluid again. If you have cysts or abscesses that keep returning and have required incision and drainage multiple times, your health care provider may talk to you about surgery to remove the Bartholin gland. HOME CARE INSTRUCTIONS  Take medicines only as directed by your health care provider.  If you were prescribed an antibiotic medicine, finish it all even if you start to feel better.  Apply warm, wet compresses to the area or take warm, shallow baths that cover your pelvic region (sitz baths) several times a day or as directed by your health care provider.  Do not squeeze the cyst or apply heavy pressure to it.  Do not have sexual intercourse until the cyst has gone away.  If your cyst or abscess was   opened, a small piece of gauze or a drain may have been placed in the area to allow drainage. Do not remove the gauze or the drain until directed by your health care provider.  Wear feminine pads--not tampons--as needed for any drainage or bleeding.  Keep all follow-up visits as directed by your health care provider. This is important. PREVENTION Take these steps to help prevent a Bartholin cyst from returning:  Practice good hygiene.   Clean your vaginal area with mild soap and a soft cloth when you bathe.  Practice safe sex to prevent  STDs. SEEK MEDICAL CARE IF:  You have increased pain, swelling, or redness in the area of the cyst.  Puslike drainage is coming from the cyst.  You have a fever.   This information is not intended to replace advice given to you by your health care provider. Make sure you discuss any questions you have with your health care provider.   Document Released: 10/18/2005 Document Revised: 11/08/2014 Document Reviewed: 06/03/2014 Elsevier Interactive Patient Education 2016 [employer]  

## 2016-01-21 NOTE — Progress Notes (Signed)
   Subjective:    Patient ID: Susan Drake, female    DOB: Feb 29, 1981, 35 y.o.   MRN: [id number]  HPI Chief Complaint  Patient presents with  . vagina    boil in vagina since monday night   She is here with complaints of a 3 day history of a boil to vaginal area. States it feels hard like an almond. Painful when sitting and when standing up. Has taken Ibuprofen yesterday and using warm compresses.  Reports having a boil to the area about 2 years ago. Denies fever, chills, nausea, vomiting, or diarrhea.  Denies history of MRSA or diabetes. She is immunocompetent.    Review of Systems Pertinent positives and negatives in the history of present illness.     Objective:   Physical Exam BP 130/80 mmHg  Pulse 64  Wt 153 lb (69.4 kg) Oral temp 99.0  1.5 to 2 cm firm mass with induration to 8 o'clock position, bartholin gland area, extending into vaginal wall, severely tender, no surrounding erythema or fluctuance. No drainage.      Assessment & Plan:  Bartholin's gland abscess - Plan: sulfamethoxazole-trimethoprim (BACTRIM DS,SEPTRA DS) 800-160 MG tablet  Discussed that she appears to have an abscess of the Bartholin gland and recommend that she continue warm compresses and start the antibiotic. She can take tylenol or Ibuprofen for discomfort. The patient would like to follow up with gynecologist for possible I and D and I am ok with this. I do not feel today that this is ready to be incised but this may continue to worsen. She plans to schedule with her gynecologist and if not she will return here.

## 2016-04-28 ENCOUNTER — Encounter: Payer: Self-pay | Admitting: Medical

## 2016-04-28 ENCOUNTER — Ambulatory Visit (INDEPENDENT_AMBULATORY_CARE_PROVIDER_SITE_OTHER): Payer: 59 | Admitting: Medical

## 2016-04-28 VITALS — BP 116/80 | HR 96 | Wt 155.0 lb

## 2016-04-28 DIAGNOSIS — L0292 Furuncle, unspecified: Secondary | ICD-10-CM | POA: Diagnosis not present

## 2016-04-28 MED ORDER — CEPHALEXIN 500 MG PO CAPS: 500.0000 mg | ORAL_CAPSULE | Freq: Four times a day (QID) | ORAL | Status: DC

## 2016-04-28 NOTE — Progress Notes (Signed)
  Subjective:   Susan Drake is a 35 y.o. female who presents for evaluation of a boil  Lesion is located in the left buttock, close to perineum. Onset was 1 week ago. Symptoms have gradually worsened.  lesion has associated symptoms of pain.  No drainage.   Denies blister or white head.  Using hot soaks.  Wants antibiotic.   Patient does have previous history of cutaneous abscesses.   Patient denies hx/o MRSA.  Patient denies hx/o I&D for similar.  Patient does not have diabetes.  Patient denies hx/o poor wound healing, compromised immunity or HIV.  No other aggravating or relieving factors.  No other c/o.  Past Medical History  Diagnosis Date  . Fibroid     Dr. Garwin Brothers  . Constipation   . Seasonal allergic rhinitis   . Uterine fibroid   . Dermatological disease     sees Dr. Allyson Sabal, bilat upper arms and chest  . History of chlamydia     hx/o trichomonas and chlamydia in remote past    Reviewed prior allergies, medications, past medical history, past surgical history.  ROS as in subjective   Objective:   Filed Vitals:   04/28/16 1459  BP: 116/80  Pulse: 96    Gen: wd, wn, nad Skin: exam chaperoned by nurse.   Left buttock in inferior area adjacent to perineum with tender indurated 3cm lesion raised, with ulcerated center, no drainage.  No warmth or erythema.     Assessment:   Encounter Diagnosis  Name Primary?  Susan Drake Yes    Plan:   Discussed examination findings, diagnosis, usual course of illness, and options for therapy discussed. After discussing recommendations,begin Keflex.  Advised patient to complete the course of oral antibiotics, use warm compresses or heat applied to the area to promote drainage.  If not improved by Friday call back.  If much worse in the next few days, then recheck.   Can try Hibiclens wash OTC as a preventative measure.    Call or recheck in 1 week.

## 2016-04-29 ENCOUNTER — Telehealth: Payer: Self-pay

## 2016-04-29 NOTE — Telephone Encounter (Signed)
Pt called stating that this morning she woke up thinking she now has a sinus infection. Wants to know if it is safe to take mucinex. Told pt that was fine after conferring with [place]. Pt stated that she would call back if worsens

## 2016-05-06 ENCOUNTER — Other Ambulatory Visit: Payer: Self-pay

## 2016-05-06 ENCOUNTER — Telehealth: Payer: Self-pay | Admitting: Medical

## 2016-05-06 MED ORDER — FLUCONAZOLE 150 MG PO TABS: ORAL_TABLET | ORAL | Status: DC

## 2016-05-06 NOTE — Telephone Encounter (Signed)
Pt is still on the antibiotic you gave her but now has yeast infection, with itching and little burning & some tingling.  Would like something called into [place]

## 2016-05-06 NOTE — Telephone Encounter (Signed)
Pt informed and verbalized understanding

## 2016-05-06 NOTE — Telephone Encounter (Signed)
Let her know that I called something in 

## 2016-08-12 ENCOUNTER — Other Ambulatory Visit: Payer: Self-pay | Admitting: Medical

## 2016-08-12 DIAGNOSIS — M5441 Lumbago with sciatica, right side: Secondary | ICD-10-CM

## 2016-08-12 NOTE — Telephone Encounter (Signed)
Is this okay to refill? 

## 2016-11-04 DIAGNOSIS — L819 Disorder of pigmentation, unspecified: Secondary | ICD-10-CM | POA: Insufficient documentation

## 2016-12-25 ENCOUNTER — Other Ambulatory Visit: Payer: Self-pay | Admitting: Medical

## 2017-01-10 ENCOUNTER — Ambulatory Visit (INDEPENDENT_AMBULATORY_CARE_PROVIDER_SITE_OTHER): Payer: 59 | Admitting: Medical

## 2017-01-10 ENCOUNTER — Encounter: Payer: Self-pay | Admitting: Medical

## 2017-01-10 VITALS — BP 130/80 | HR 100 | Wt 154.0 lb

## 2017-01-10 DIAGNOSIS — E559 Vitamin D deficiency, unspecified: Secondary | ICD-10-CM

## 2017-01-10 DIAGNOSIS — K5909 Other constipation: Secondary | ICD-10-CM | POA: Insufficient documentation

## 2017-01-10 DIAGNOSIS — J309 Allergic rhinitis, unspecified: Secondary | ICD-10-CM

## 2017-01-10 DIAGNOSIS — R5383 Other fatigue: Secondary | ICD-10-CM | POA: Diagnosis not present

## 2017-01-10 HISTORY — DX: Allergic rhinitis, unspecified: J30.9

## 2017-01-10 MED ORDER — LEVOCETIRIZINE DIHYDROCHLORIDE 5 MG PO TABS: 5.0000 mg | ORAL_TABLET | Freq: Every evening | ORAL | 3 refills | Status: DC

## 2017-01-10 MED ORDER — FLUTICASONE PROPIONATE 50 MCG/ACT NA SUSP: 2.0000 | Freq: Every day | NASAL | 11 refills | Status: DC

## 2017-01-10 NOTE — Progress Notes (Signed)
Subjective: Chief Complaint  Patient presents with  . med check    ned check and refill   Here for routine med check.    OB/Gyn will doing blood work on her this month when she sees them.   Sees OB/Gyn Dr. Garwin Brothers.  Saw them recently for polyp but also mentioned fatigue.    Had some evaluation, reportedly liver tests elevated.     Constipation - sometimes sees mucous in the stool, sometimes grease appearing.  Having BM every 1-2 days.  Using probiotic. Has seen GI in the past.   Didn't like Linzess.  Has tried Amitiza as well.   Sometimes gets fullness in right lower side before she is able to have BM.  No prior colonoscopy, no blood in stool in past year or more.  Taking her routine allergy medication  Recently was started on Vit D.    Past Medical History:  Diagnosis Date  . Constipation   . Dermatological disease    sees Dr. Allyson Sabal, bilat upper arms and chest  . Fibroid    Dr. Garwin Brothers  . History of chlamydia    hx/o trichomonas and chlamydia in remote past  . Seasonal allergic rhinitis   . Uterine fibroid    Current Outpatient Prescriptions on File Prior to Visit  Medication Sig Dispense Refill  . fluticasone (FLONASE) 50 MCG/ACT nasal spray PLACE 2 SPRAYS INTO BOTH NOSTRILS DAILY. 16 g 0  . ibuprofen (ADVIL,MOTRIN) 200 MG tablet Take 800 mg by mouth 2 (two) times daily as needed. Reported on 04/28/2016    . Multiple Vitamins-Minerals (MULTIVITAMIN WITH MINERALS) tablet Take 1 tablet by mouth daily. Reported on 04/28/2016    . Probiotic Product (PROBIOTIC DAILY PO) Take by mouth.    Marland Kitchen tiZANidine (ZANAFLEX) 4 MG tablet TAKE 1 TABLET (4 MG TOTAL) BY MOUTH AT BEDTIME. 20 tablet 0   No current facility-administered medications on file prior to visit.    Past Surgical History:  Procedure Laterality Date  . MYOMECTOMY  05/03/2012   Procedure: MYOMECTOMY;  Surgeon: Marvene Staff, MD;  Location: [facility];  Service: Gynecology;  Laterality: N/A;   Exploratory Laparotomy  myomectomy. Open at 1410.  Marland Kitchen ROBOT ASSISTED MYOMECTOMY  05/03/2012   Procedure: ROBOTIC ASSISTED MYOMECTOMY;  Surgeon: Marvene Staff, MD;  Location: [facility];  Service: Gynecology;  Laterality: N/A;  Times two fibroids with robot.  . WISDOM TOOTH EXTRACTION  2011    ROS as in subjective   Objective: BP 130/80   Pulse 100   Wt 154 lb (69.9 kg)   SpO2 99%   BMI 27.28 kg/m   General appearance: alert, no distress, WD/WN,  HEENT: normocephalic, sclerae anicteric, TMs pearly, nares patent, no discharge or erythema, pharynx normal Oral cavity: MMM, no lesions Neck: supple, no lymphadenopathy, no thyromegaly, no masses Heart: RRR, normal S1, S2, no murmurs Lungs: CTA bilaterally, no wheezes, rhonchi, or rales Abdomen: +bs, soft, mild RLQ tenderness, otherwise non tender, non distended, no masses, no hepatomegaly, no splenomegaly Pulses: 2+ symmetric, upper and lower extremities, normal cap refill     Assessment: Encounter Diagnoses  Name Primary?  . Chronic constipation   . Allergic rhinitis, unspecified chronicity, unspecified seasonality, unspecified trigger   . Vitamin D deficiency Yes  . Fatigue, unspecified type      Plan: Allergic rhinism - does fine on xyzal, avoid triggers, refills sent  constipation - prior consults with GI.   She will check with pharmacist about changing probiotics since current one  is $50/mo.   Consider another trial of Linzess or Amitiza.  She is improved compared to the prior year when we discussed this.   C/t good hydration and fiber intake  Vit D deficiency - advised OTC vit D 1000 IU daily.    Fatigue - she has f/u soon with gyn, and will have them [facility]) forward labs and office notes.    Surah was seen today for med check.  Diagnoses and all orders for this visit:  Vitamin D deficiency  Chronic constipation  Allergic rhinitis, unspecified chronicity, unspecified seasonality, unspecified trigger  Fatigue, unspecified  type  Other orders -     levocetirizine (XYZAL) 5 MG tablet; Take 1 tablet (5 mg total) by mouth every evening.

## 2017-01-25 DIAGNOSIS — E559 Vitamin D deficiency, unspecified: Secondary | ICD-10-CM | POA: Diagnosis not present

## 2017-01-25 DIAGNOSIS — R7989 Other specified abnormal findings of blood chemistry: Secondary | ICD-10-CM | POA: Diagnosis not present

## 2017-06-02 DIAGNOSIS — N920 Excessive and frequent menstruation with regular cycle: Secondary | ICD-10-CM | POA: Diagnosis not present

## 2017-08-08 DIAGNOSIS — N951 Menopausal and female climacteric states: Secondary | ICD-10-CM | POA: Diagnosis not present

## 2017-08-09 DIAGNOSIS — E039 Hypothyroidism, unspecified: Secondary | ICD-10-CM | POA: Diagnosis not present

## 2017-08-09 DIAGNOSIS — E559 Vitamin D deficiency, unspecified: Secondary | ICD-10-CM | POA: Diagnosis not present

## 2017-08-09 DIAGNOSIS — G479 Sleep disorder, unspecified: Secondary | ICD-10-CM | POA: Diagnosis not present

## 2017-08-26 DIAGNOSIS — E559 Vitamin D deficiency, unspecified: Secondary | ICD-10-CM | POA: Diagnosis not present

## 2017-09-01 DIAGNOSIS — E559 Vitamin D deficiency, unspecified: Secondary | ICD-10-CM | POA: Diagnosis not present

## 2017-09-01 DIAGNOSIS — R790 Abnormal level of blood mineral: Secondary | ICD-10-CM | POA: Diagnosis not present

## 2017-09-08 DIAGNOSIS — E559 Vitamin D deficiency, unspecified: Secondary | ICD-10-CM | POA: Diagnosis not present

## 2017-09-08 DIAGNOSIS — R790 Abnormal level of blood mineral: Secondary | ICD-10-CM | POA: Diagnosis not present

## 2017-09-15 DIAGNOSIS — E559 Vitamin D deficiency, unspecified: Secondary | ICD-10-CM | POA: Diagnosis not present

## 2017-10-06 DIAGNOSIS — R790 Abnormal level of blood mineral: Secondary | ICD-10-CM | POA: Diagnosis not present

## 2017-10-06 DIAGNOSIS — E559 Vitamin D deficiency, unspecified: Secondary | ICD-10-CM | POA: Diagnosis not present

## 2017-10-13 ENCOUNTER — Encounter: Payer: Self-pay | Admitting: Family Medicine

## 2017-10-13 ENCOUNTER — Ambulatory Visit (INDEPENDENT_AMBULATORY_CARE_PROVIDER_SITE_OTHER): Payer: 59 | Admitting: Family Medicine

## 2017-10-13 VITALS — BP 112/72 | HR 65 | Temp 98.7°F | Wt 149.8 lb

## 2017-10-13 DIAGNOSIS — J309 Allergic rhinitis, unspecified: Secondary | ICD-10-CM | POA: Diagnosis not present

## 2017-10-13 NOTE — Progress Notes (Signed)
   Subjective:    Patient ID: Susan Drake, female    DOB: 1981/09/30, 36 y.o.   MRN: [id number]  HPI She developed a slight sore throat on Tuesday of this week but no other symptoms.  She does have underlying allergies and did use Zyrtec.  Yesterday the throat pain went away but she did note some sneezing.  Today she has some nasal congestion, watery eyes, sneezing but no sore throat, earache, cough, fever or chills.   Review of Systems     Objective:   Physical Exam Alert and in no distress. Tympanic membranes and canals are normal. Pharyngeal area is normal. Neck is supple without adenopathy or thyromegaly. Cardiac exam shows a regular sinus rhythm without murmurs or gallops. Lungs are clear to auscultation.        Assessment & Plan:  Allergic rhinitis, unspecified seasonality, unspecified trigger Recommend supportive care with either Allegra-D or Claritin-D.  Call if further difficulty.

## 2017-10-13 NOTE — Patient Instructions (Signed)
Take either Allegra-D or Claritin-D

## 2017-10-20 DIAGNOSIS — E559 Vitamin D deficiency, unspecified: Secondary | ICD-10-CM | POA: Diagnosis not present

## 2017-11-04 ENCOUNTER — Ambulatory Visit (INDEPENDENT_AMBULATORY_CARE_PROVIDER_SITE_OTHER): Payer: 59 | Admitting: Medical

## 2017-11-04 ENCOUNTER — Encounter: Payer: Self-pay | Admitting: Medical

## 2017-11-04 VITALS — BP 110/78 | HR 90 | Wt 142.8 lb

## 2017-11-04 DIAGNOSIS — R0789 Other chest pain: Secondary | ICD-10-CM

## 2017-11-04 NOTE — Progress Notes (Signed)
Subjective: Chief Complaint  Patient presents with  . Chest Pain    hit in the chest , sore and tenderness  x 3-4 month ago    Here for complaint of chest wall pain.  She reports back in the summer around July, was at the gym.   She went to get a paper towel from the paper towel dispenser, and there happened to be a spray bottle on top of the paper towel dispenser.  This ended up falling off hitting her in the chest went to get paper towel from dispense, but spray bottle fell off hitting her in chest.  She reported quite a bit of localized pain for about a week without obvious bruising, but  did not think much about it at the time.  However over the last several months she continues to get intermittent localized pain in the upper chest wall and sternum area where the  spray bottle hit her.  If she bends over sometimes this aggravates the pain, if she is lying in bed and turns her will sometimes feel the pain.  She cannot reproduce the pain otherwise.  She denies shortness of breath, no palpitations of the heart, no acid reflux problems.  Denies any breast mass or lump that is new although she does get fibrocystic tissue and some discomfort around the time of her period that is cyclical.  No other aggravating or relieving factors. No other complaint.  Past Medical History:  Diagnosis Date  . Constipation   . Dermatological disease    sees Dr. Allyson Sabal, bilat upper arms and chest  . Fibroid    Dr. Garwin Brothers  . History of chlamydia    hx/o trichomonas and chlamydia in remote past  . Seasonal allergic rhinitis   . Uterine fibroid    Current Outpatient Medications on File Prior to Visit  Medication Sig Dispense Refill  . fluticasone (FLONASE) 50 MCG/ACT nasal spray Place 2 sprays into both nostrils daily. 16 g 11  . ibuprofen (ADVIL,MOTRIN) 200 MG tablet Take 800 mg by mouth 2 (two) times daily as needed. Reported on 04/28/2016    . levocetirizine (XYZAL) 5 MG tablet Take 1 tablet (5 mg total) by mouth  every evening. 90 tablet 3  . Probiotic Product (PROBIOTIC DAILY PO) Take by mouth.    . Vitamin D, Ergocalciferol, (DRISDOL) 50000 units CAPS capsule TAKE 1 CAPSULE BY MOUTH ONCE A WEEK FOR 4 MONTHS. NEEDS REPEAT LAB AFTER COMPLETION  0  . Multiple Vitamins-Minerals (MULTIVITAMIN WITH MINERALS) tablet Take 1 tablet by mouth daily. Reported on 04/28/2016    . tiZANidine (ZANAFLEX) 4 MG tablet TAKE 1 TABLET (4 MG TOTAL) BY MOUTH AT BEDTIME. (Patient not taking: Reported on 10/13/2017) 20 tablet 0   No current facility-administered medications on file prior to visit.    ROS as in subjective    Objective: BP 110/78   Pulse 90   Wt 142 lb 12.8 oz (64.8 kg)   SpO2 99%   BMI 25.30 kg/m   Gen: wd, wn, nad Skin: unremarkable, no bruising, no erythema She is mildly tender over the upper sternum and bilateral medial ribs just superior to the sternum, but no swelling no bruising no deformity no bony changes.  Chest wall nontender otherwise normal inspiration and expiration.  Lungs clear Heart RRR, normal S1-S2, no murmurs Arms nontender, normal range of motion   Assessment; Encounter Diagnoses  Name Primary?  . Chest wall pain Yes  . Sternum pain     Plan  We discussed her symptoms and exam findings.  To reassure her we will send for chest x-ray/rib x-ray  I advised to use a daily stretching and exercise regimen in general, can use heat pad or ice from time to time if needed  Otherwise reassured.  Fonnie was seen today for chest pain.  Diagnoses and all orders for this visit:  Chest wall pain -     DG Ribs Bilateral W/Chest; Future  Sternum pain -     DG Ribs Bilateral W/Chest; Future

## 2017-11-10 ENCOUNTER — Ambulatory Visit
Admission: RE | Admit: 2017-11-10 | Discharge: 2017-11-10 | Disposition: A | Discharge status: Home / Self Care | Payer: 59 | Source: Ambulatory Visit | Attending: Medical | Admitting: Medical

## 2017-11-10 DIAGNOSIS — S299XXA Unspecified injury of thorax, initial encounter: Secondary | ICD-10-CM | POA: Diagnosis not present

## 2017-11-10 DIAGNOSIS — R0789 Other chest pain: Secondary | ICD-10-CM | POA: Diagnosis not present

## 2017-12-23 DIAGNOSIS — N920 Excessive and frequent menstruation with regular cycle: Secondary | ICD-10-CM | POA: Diagnosis not present

## 2018-01-03 DIAGNOSIS — M940 Chondrocostal junction syndrome [Tietze]: Secondary | ICD-10-CM | POA: Diagnosis not present

## 2018-01-03 DIAGNOSIS — R0982 Postnasal drip: Secondary | ICD-10-CM | POA: Diagnosis not present

## 2018-02-20 LAB — HM PAP SMEAR: HM Pap smear: NEGATIVE

## 2018-02-24 ENCOUNTER — Other Ambulatory Visit: Payer: Self-pay | Admitting: Obstetrics and Gynecology

## 2018-02-24 DIAGNOSIS — N644 Mastodynia: Secondary | ICD-10-CM

## 2018-02-24 DIAGNOSIS — N63 Unspecified lump in unspecified breast: Secondary | ICD-10-CM

## 2018-03-01 ENCOUNTER — Ambulatory Visit
Admission: RE | Admit: 2018-03-01 | Discharge: 2018-03-01 | Disposition: A | Discharge status: Home / Self Care | Payer: 59 | Source: Ambulatory Visit | Attending: Obstetrics and Gynecology | Admitting: Obstetrics and Gynecology

## 2018-03-01 DIAGNOSIS — N63 Unspecified lump in unspecified breast: Secondary | ICD-10-CM

## 2018-03-01 DIAGNOSIS — N644 Mastodynia: Secondary | ICD-10-CM

## 2018-09-27 ENCOUNTER — Encounter: Payer: Self-pay | Admitting: Medical

## 2018-09-27 ENCOUNTER — Ambulatory Visit (INDEPENDENT_AMBULATORY_CARE_PROVIDER_SITE_OTHER): Payer: 59 | Admitting: Medical

## 2018-09-27 VITALS — BP 128/82 | HR 80 | Temp 98.1°F | Ht 62.0 in | Wt 155.8 lb

## 2018-09-27 DIAGNOSIS — J309 Allergic rhinitis, unspecified: Secondary | ICD-10-CM

## 2018-09-27 DIAGNOSIS — Z0000 Encounter for general adult medical examination without abnormal findings: Secondary | ICD-10-CM | POA: Insufficient documentation

## 2018-09-27 DIAGNOSIS — Z7189 Other specified counseling: Secondary | ICD-10-CM | POA: Diagnosis not present

## 2018-09-27 DIAGNOSIS — Z7185 Encounter for immunization safety counseling: Secondary | ICD-10-CM | POA: Insufficient documentation

## 2018-09-27 DIAGNOSIS — Z23 Encounter for immunization: Secondary | ICD-10-CM | POA: Diagnosis not present

## 2018-09-27 NOTE — Progress Notes (Signed)
Subjective: Chief Complaint  Patient presents with  . wants to discuss flu shot   Here for concerns about flu shot.  No prior flu shot.  Her employer is requiring this.    Works as Education officer, museum for [employer]. She is scared about getting a flu shot but wants to talk with me about this.  Last Td vaccine is possibly up-to-date within the last 5 years, she thinks she had this through gynecology  Dad passed in May this past year with blood clots, heart failure, lung problems.   Mom has had cancer.  Mom had non hodgkin lymphoma.  She continues to take her routine medications  No particular health concerns during  Past Medical History:  Diagnosis Date  . Constipation   . Dermatological disease    sees Dr. Allyson Sabal, bilat upper arms and chest  . Fibroid    Dr. Garwin Brothers  . History of chlamydia    hx/o trichomonas and chlamydia in remote past  . Seasonal allergic rhinitis   . Uterine fibroid    Current Outpatient Medications on File Prior to Visit  Medication Sig Dispense Refill  . fexofenadine-pseudoephedrine (ALLEGRA-D 24) 180-240 MG 24 hr tablet Take 1 tablet by mouth daily.    . fluticasone (FLONASE) 50 MCG/ACT nasal spray Place 2 sprays into both nostrils daily. 16 g 11  . ibuprofen (ADVIL,MOTRIN) 200 MG tablet Take 800 mg by mouth 2 (two) times daily as needed. Reported on 04/28/2016    . Multiple Vitamins-Minerals (MULTIVITAMIN WITH MINERALS) tablet Take 1 tablet by mouth daily. Reported on 04/28/2016    . Probiotic Product (PROBIOTIC DAILY PO) Take by mouth.    . Vitamin D, Ergocalciferol, (DRISDOL) 50000 units CAPS capsule TAKE 1 CAPSULE BY MOUTH ONCE A WEEK FOR 4 MONTHS. NEEDS REPEAT LAB AFTER COMPLETION  0  . levocetirizine (XYZAL) 5 MG tablet Take 1 tablet (5 mg total) by mouth every evening. (Patient not taking: Reported on 09/27/2018) 90 tablet 3  . tiZANidine (ZANAFLEX) 4 MG tablet TAKE 1 TABLET (4 MG TOTAL) BY MOUTH AT BEDTIME. (Patient not taking: Reported on 10/13/2017)  20 tablet 0   No current facility-administered medications on file prior to visit.     ROS as in subjective   Objective: BP 128/82 (BP Location: Right Arm, Patient Position: Sitting, Cuff Size: Normal)   Pulse 80   Temp 98.1 F (36.7 C) (Oral)   Ht 5\' 2"  (1.575 m)   Wt 155 lb 12.8 oz (70.7 kg)   BMI 28.50 kg/m   General: Well-developed well-nourished no acute distress Otherwise not examined   Assessment: Encounter Diagnoses  Name Primary?  . Allergic rhinitis, unspecified seasonality, unspecified trigger Yes  . Need for influenza vaccination   . Vaccine counseling   . Counseling on health promotion and disease prevention     Plan: Counseled on disease prevention, purpose of vaccines, benefits of vaccinations, risks.  Counseled on the influenza virus vaccine.  Vaccine information sheet given.  Influenza vaccine given after consent obtained.  We will request records from gynecology for last Pap smear last labs last tetanus vaccine through Dr. Garwin Brothers over at [facility].   Return soon for physical  Margarethe was seen today for wants to discuss flu shot.  Diagnoses and all orders for this visit:  Allergic rhinitis, unspecified seasonality, unspecified trigger  Need for influenza vaccination  Vaccine counseling  Counseling on health promotion and disease prevention  Other orders -     Flu Vaccine QUAD 6+ mos  PF IM (Fluarix Quad PF)

## 2018-10-06 ENCOUNTER — Telehealth: Payer: Self-pay | Admitting: Medical

## 2018-10-06 NOTE — Telephone Encounter (Signed)
Received requested pap from [facility] obgyn. We also requested notes and immunizations. I called to inquire on those.

## 2018-10-19 ENCOUNTER — Encounter: Payer: Self-pay | Admitting: Internal Medicine

## 2018-11-20 ENCOUNTER — Other Ambulatory Visit: Payer: Self-pay | Admitting: Medical

## 2019-01-04 ENCOUNTER — Other Ambulatory Visit: Payer: Self-pay | Admitting: Medical

## 2019-01-16 ENCOUNTER — Other Ambulatory Visit: Payer: Self-pay

## 2019-01-16 ENCOUNTER — Telehealth: Payer: Self-pay | Admitting: Medical

## 2019-01-16 DIAGNOSIS — J309 Allergic rhinitis, unspecified: Secondary | ICD-10-CM

## 2019-01-16 MED ORDER — FLUTICASONE PROPIONATE 50 MCG/ACT NA SUSP: 2.0000 | Freq: Every day | NASAL | 3 refills | Status: DC

## 2019-01-16 NOTE — Telephone Encounter (Signed)
Is this ok?

## 2019-01-16 NOTE — Telephone Encounter (Signed)
Done

## 2019-01-16 NOTE — Telephone Encounter (Signed)
Patient notified

## 2019-01-16 NOTE — Telephone Encounter (Signed)
That is fine, send flonase

## 2019-01-16 NOTE — Telephone Encounter (Signed)
Pt received a call to reschedule her April appt. She called back and did not reschedule. She states she would rather wait until Virus calms down. She is requesting a refill on Flonase. Please send to [facility]. Pt can be reached at [phone].

## 2019-02-08 ENCOUNTER — Encounter: Payer: 59 | Admitting: Medical

## 2019-05-17 ENCOUNTER — Telehealth: Payer: Self-pay | Admitting: Internal Medicine

## 2019-05-17 NOTE — Telephone Encounter (Signed)
Pt is having some numbness and tingling on right arm for the last 3 days. Headache was the last 3 days. She did check bp, bp is 164/104. Temp is 97.4. she did take ibuprofen. She wants to know should she increase her water intake and should she be checking her bp multiple times through the day before she is seen

## 2019-05-17 NOTE — Telephone Encounter (Signed)
Pt was notified of results

## 2019-05-17 NOTE — Telephone Encounter (Signed)
I would use tylenol instead of ibuprofen if not allergic to it, hydrate at least 64 oz daily, and can certainly f/u in a few days or make appt if ongoing symptoms.   She normally doesn't have BP issues.

## 2019-06-07 ENCOUNTER — Other Ambulatory Visit: Payer: Self-pay | Admitting: Obstetrics and Gynecology

## 2019-06-18 ENCOUNTER — Encounter (HOSPITAL_COMMUNITY): Payer: Self-pay

## 2019-06-18 ENCOUNTER — Other Ambulatory Visit (HOSPITAL_COMMUNITY)
Admission: RE | Admit: 2019-06-18 | Discharge: 2019-06-18 | Disposition: A | Discharge status: Home / Self Care | Payer: 59 | Source: Ambulatory Visit | Attending: Obstetrics and Gynecology | Admitting: Obstetrics and Gynecology

## 2019-06-18 ENCOUNTER — Encounter (HOSPITAL_COMMUNITY)
Admission: RE | Admit: 2019-06-18 | Discharge: 2019-06-18 | Disposition: A | Discharge status: Home / Self Care | Payer: 59 | Source: Ambulatory Visit | Attending: Obstetrics and Gynecology | Admitting: Obstetrics and Gynecology

## 2019-06-18 ENCOUNTER — Other Ambulatory Visit: Payer: Self-pay

## 2019-06-18 DIAGNOSIS — Z20828 Contact with and (suspected) exposure to other viral communicable diseases: Secondary | ICD-10-CM | POA: Insufficient documentation

## 2019-06-18 DIAGNOSIS — Z01812 Encounter for preprocedural laboratory examination: Secondary | ICD-10-CM | POA: Insufficient documentation

## 2019-06-18 LAB — TYPE AND SCREEN
ABO/RH(D): O POS
Antibody Screen: NEGATIVE

## 2019-06-18 LAB — ABO/RH: ABO/RH(D): O POS

## 2019-06-18 LAB — CBC
HCT: 38.5 % (ref 36.0–46.0)
Hemoglobin: 11.6 g/dL — ABNORMAL LOW (ref 12.0–15.0)
MCH: 24.9 pg — ABNORMAL LOW (ref 26.0–34.0)
MCHC: 30.1 g/dL (ref 30.0–36.0)
MCV: 82.8 fL (ref 80.0–100.0)
Platelets: 180 10*3/uL (ref 150–400)
RBC: 4.65 MIL/uL (ref 3.87–5.11)
RDW: 14.6 % (ref 11.5–15.5)
WBC: 6.4 10*3/uL (ref 4.0–10.5)
nRBC: 0 % (ref 0.0–0.2)

## 2019-06-18 LAB — SURGICAL PCR SCREEN
MRSA, PCR: NEGATIVE
Staphylococcus aureus: NEGATIVE

## 2019-06-18 LAB — SARS CORONAVIRUS 2 (TAT 6-24 HRS): SARS Coronavirus 2: NEGATIVE

## 2019-06-18 NOTE — Progress Notes (Addendum)
PCP - Chana Bode, PA-C Cardiologist - pt denies  Chest x-ray - pt denies EKG - pt denies  Stress Test - pt denies ECHO - pt denies  Cardiac Cath - pt denies  Sleep Study - pt denies CPAP - n.a  Fasting Blood Sugar - n/a Checks Blood Sugar _____ times a day-n/a  Blood Thinner Instructions: n/a Aspirin Instructions: n/a  Anesthesia review:  NO  Patient denies shortness of breath, fever, cough and chest pain at PAT appointment  Patient verbalized understanding of instructions that were given to them at the PAT appointment. Patient was also instructed that they will need to review over the PAT instructions again at home before surgery.   Coronavirus Screening  Have you experienced the following symptoms:  Cough yes/no: No Fever (>100.33F)  yes/no: No Runny nose yes/no: No Sore throat yes/no: No Difficulty breathing/shortness of breath  yes/no: No  Have you or a family member traveled in the last 14 days and where? yes/no: No   If the patient indicates "YES" to the above questions, their PAT will be rescheduled to limit the exposure to others and, the surgeon will be notified. THE PATIENT WILL NEED TO BE ASYMPTOMATIC FOR 14 DAYS.   If the patient is not experiencing any of these symptoms, the PAT nurse will instruct them to NOT bring anyone with them to their appointment since they may have these symptoms or traveled as well.   Please remind your patients and families that hospital visitation restrictions are in effect and the importance of the restrictions.

## 2019-06-18 NOTE — Pre-Procedure Instructions (Addendum)
MERTHA CLYATT  06/18/2019      [id number] - [address], [address] [place] [address] Phone: [phone] Fax: [phone]    Your procedure is scheduled on Thursday, June 21, 2019.  Report to [facility] Admitting at 530 AM.  Call this number if you have problems the morning of surgery:  [phone]   Remember:  Do not eat after midnight (Wednesday).  You may drink clear liquids until 430 AM.  Clear liquids allowed are:         Water, Juice (non-citric and without pulp), Clear Tea, Black Coffee only and Gatorade    Take these medicines the morning of surgery with A SIP OF WATER  Flonase nasal spray-if needed  Beginning now, STOP taking any Aspirin (unless otherwise instructed by your surgeon), Aleve, Naproxen, Ibuprofen, Motrin, Advil, Goody's, BC's, all herbal medications, fish oil, and all vitamins    Day of surgery:  Do not wear jewelry, make-up or nail polish.  Do not wear lotions, powders, or perfumes, or deodorant.  Do not shave 48 hours prior to surgery.   Do not bring valuables to the hospital.  [facility] is not responsible for any belongings or valuables.  Contacts, dentures or bridgework may not be worn into surgery.  Leave your suitcase in the car.  After surgery it may be brought to your room.  For patients admitted to the hospital, discharge time will be determined by your treatment team.  Patients discharged the day of surgery will not be allowed to drive home.    [facility]- Preparing For Surgery  Before surgery, you can play an important role. Because skin is not sterile, your skin needs to be as free of germs as possible. You can reduce the number of germs on your skin by washing with CHG (chlorahexidine gluconate) Soap before surgery.  CHG is an antiseptic cleaner which kills germs and bonds with the skin to continue killing germs even after washing.    Oral Hygiene is also important to reduce  your risk of infection.  Remember - BRUSH YOUR TEETH THE MORNING OF SURGERY WITH YOUR REGULAR TOOTHPASTE  Please do not use if you have an allergy to CHG or antibacterial soaps. If your skin becomes reddened/irritated stop using the CHG.  Do not shave (including legs and underarms) for at least 48 hours prior to first CHG shower. It is OK to shave your face.  Please follow these instructions carefully.   1. Shower the NIGHT BEFORE SURGERY and the MORNING OF SURGERY with CHG.   2. If you chose to wash your hair, wash your hair first as usual with your normal shampoo.  3. After you shampoo, rinse your hair and body thoroughly to remove the shampoo.  4. Use CHG as you would any other liquid soap. You can apply CHG directly to the skin and wash gently with a scrungie or a clean washcloth.   5. Apply the CHG Soap to your body ONLY FROM THE NECK DOWN.  Do not use on open wounds or open sores. Avoid contact with your eyes, ears, mouth and genitals (private parts). Wash Face and genitals (private parts)  with your normal soap.  6. Wash thoroughly, paying special attention to the area where your surgery will be performed.  7. Thoroughly rinse your body with warm water from the neck down.  8. DO NOT shower/wash with your normal soap after using and rinsing off the  CHG Soap.  9. Pat yourself dry with a CLEAN TOWEL.  10. Wear CLEAN PAJAMAS to bed the night before surgery, wear comfortable clothes the morning of surgery  11. Place CLEAN SHEETS on your bed the night of your first shower and DO NOT SLEEP WITH PETS.  Day of Surgery: Shower as above Do not apply any deodorants/lotions.  Please wear clean clothes to the hospital/surgery center.   Remember to brush your teeth WITH YOUR REGULAR TOOTHPASTE.  Please read over the following fact sheets that you were given.

## 2019-06-21 ENCOUNTER — Encounter (HOSPITAL_COMMUNITY)
Admission: RE | Disposition: A | Discharge status: Home / Self Care | Payer: Self-pay | Source: Home / Self Care | Attending: Obstetrics and Gynecology

## 2019-06-21 ENCOUNTER — Other Ambulatory Visit: Payer: Self-pay

## 2019-06-21 ENCOUNTER — Inpatient Hospital Stay (HOSPITAL_COMMUNITY)
Admission: RE | Admit: 2019-06-21 | Discharge: 2019-06-23 | DRG: 743 | Disposition: A | Discharge status: Home / Self Care | Payer: 59 | Attending: Obstetrics and Gynecology | Admitting: Obstetrics and Gynecology

## 2019-06-21 ENCOUNTER — Inpatient Hospital Stay (HOSPITAL_COMMUNITY): Payer: 59 | Admitting: Certified Registered Nurse Anesthetist

## 2019-06-21 ENCOUNTER — Encounter (HOSPITAL_COMMUNITY): Payer: Self-pay | Admitting: Surgery

## 2019-06-21 DIAGNOSIS — N841 Polyp of cervix uteri: Secondary | ICD-10-CM | POA: Diagnosis present

## 2019-06-21 DIAGNOSIS — E559 Vitamin D deficiency, unspecified: Secondary | ICD-10-CM | POA: Diagnosis present

## 2019-06-21 DIAGNOSIS — D509 Iron deficiency anemia, unspecified: Secondary | ICD-10-CM | POA: Diagnosis present

## 2019-06-21 DIAGNOSIS — D252 Subserosal leiomyoma of uterus: Principal | ICD-10-CM | POA: Diagnosis present

## 2019-06-21 DIAGNOSIS — Z9889 Other specified postprocedural states: Secondary | ICD-10-CM

## 2019-06-21 DIAGNOSIS — Z20828 Contact with and (suspected) exposure to other viral communicable diseases: Secondary | ICD-10-CM | POA: Diagnosis present

## 2019-06-21 DIAGNOSIS — D259 Leiomyoma of uterus, unspecified: Secondary | ICD-10-CM | POA: Diagnosis present

## 2019-06-21 DIAGNOSIS — N920 Excessive and frequent menstruation with regular cycle: Secondary | ICD-10-CM | POA: Diagnosis present

## 2019-06-21 DIAGNOSIS — D251 Intramural leiomyoma of uterus: Secondary | ICD-10-CM | POA: Diagnosis present

## 2019-06-21 HISTORY — PX: MYOMECTOMY: SHX85

## 2019-06-21 HISTORY — DX: Anxiety disorder, unspecified: F41.9

## 2019-06-21 LAB — POCT PREGNANCY, URINE: Preg Test, Ur: NEGATIVE

## 2019-06-21 SURGERY — MYOMECTOMY, ABDOMINAL APPROACH
Anesthesia: General | Site: Abdomen

## 2019-06-21 MED ORDER — ONDANSETRON HCL 4 MG/2ML IJ SOLN: 4.0000 mg | Freq: Four times a day (QID) | INTRAMUSCULAR | Status: DC | PRN

## 2019-06-21 MED ORDER — SIMETHICONE 80 MG PO CHEW: 80.0000 mg | CHEWABLE_TABLET | Freq: Four times a day (QID) | ORAL | Status: DC | PRN

## 2019-06-21 MED ORDER — MIDAZOLAM HCL 2 MG/2ML IJ SOLN
INTRAMUSCULAR | Status: DC | PRN
  Administered 2019-06-21: 2 mg via INTRAVENOUS

## 2019-06-21 MED ORDER — IBUPROFEN 800 MG PO TABS
800.0000 mg | ORAL_TABLET | Freq: Three times a day (TID) | ORAL | Status: DC
  Administered 2019-06-21 – 2019-06-23 (×6): 800 mg via ORAL
  Filled 2019-06-21 (×6): qty 1

## 2019-06-21 MED ORDER — MEPERIDINE HCL 25 MG/ML IJ SOLN: 6.2500 mg | INTRAMUSCULAR | Status: DC | PRN

## 2019-06-21 MED ORDER — HYDROMORPHONE 1 MG/ML IV SOLN
INTRAVENOUS | Status: AC
  Filled 2019-06-21: qty 30

## 2019-06-21 MED ORDER — PHENYLEPHRINE 40 MCG/ML (10ML) SYRINGE FOR IV PUSH (FOR BLOOD PRESSURE SUPPORT)
PREFILLED_SYRINGE | INTRAVENOUS | Status: AC
  Filled 2019-06-21: qty 10

## 2019-06-21 MED ORDER — PROMETHAZINE HCL 25 MG/ML IJ SOLN: 6.2500 mg | INTRAMUSCULAR | Status: DC | PRN

## 2019-06-21 MED ORDER — DEXAMETHASONE SODIUM PHOSPHATE 10 MG/ML IJ SOLN
INTRAMUSCULAR | Status: DC | PRN
  Administered 2019-06-21: 10 mg via INTRAVENOUS

## 2019-06-21 MED ORDER — SCOPOLAMINE 1 MG/3DAYS TD PT72
MEDICATED_PATCH | TRANSDERMAL | Status: DC | PRN
  Administered 2019-06-21: 1 via TRANSDERMAL

## 2019-06-21 MED ORDER — HYDROMORPHONE HCL 1 MG/ML IJ SOLN: 0.2500 mg | INTRAMUSCULAR | Status: DC | PRN

## 2019-06-21 MED ORDER — OXYCODONE HCL 5 MG PO TABS: 5.0000 mg | ORAL_TABLET | ORAL | Status: DC | PRN

## 2019-06-21 MED ORDER — FENTANYL CITRATE (PF) 250 MCG/5ML IJ SOLN
INTRAMUSCULAR | Status: AC
  Filled 2019-06-21: qty 5

## 2019-06-21 MED ORDER — DEXTROSE IN LACTATED RINGERS 5 % IV SOLN
INTRAVENOUS | Status: DC
  Administered 2019-06-21 – 2019-06-22 (×3): via INTRAVENOUS

## 2019-06-21 MED ORDER — FENTANYL CITRATE (PF) 250 MCG/5ML IJ SOLN
INTRAMUSCULAR | Status: DC | PRN
  Administered 2019-06-21 (×2): 50 ug via INTRAVENOUS
  Administered 2019-06-21: 100 ug via INTRAVENOUS
  Administered 2019-06-21 (×3): 50 ug via INTRAVENOUS

## 2019-06-21 MED ORDER — EPHEDRINE 5 MG/ML INJ
INTRAVENOUS | Status: AC
  Filled 2019-06-21: qty 10

## 2019-06-21 MED ORDER — VASOPRESSIN 20 UNIT/ML IV SOLN
INTRAVENOUS | Status: DC | PRN
  Administered 2019-06-21: 80 mL via INTRAMUSCULAR

## 2019-06-21 MED ORDER — CEFAZOLIN SODIUM-DEXTROSE 2-4 GM/100ML-% IV SOLN
2.0000 g | INTRAVENOUS | Status: AC
  Administered 2019-06-21: 2 g via INTRAVENOUS
  Filled 2019-06-21: qty 100

## 2019-06-21 MED ORDER — MENTHOL 3 MG MT LOZG
1.0000 | LOZENGE | OROMUCOSAL | Status: DC | PRN
  Filled 2019-06-21: qty 9

## 2019-06-21 MED ORDER — DEXAMETHASONE SODIUM PHOSPHATE 10 MG/ML IJ SOLN
INTRAMUSCULAR | Status: AC
  Filled 2019-06-21: qty 1

## 2019-06-21 MED ORDER — BUPIVACAINE HCL (PF) 0.25 % IJ SOLN
INTRAMUSCULAR | Status: DC | PRN
  Administered 2019-06-21: 10 mL

## 2019-06-21 MED ORDER — SODIUM CHLORIDE (PF) 0.9 % IJ SOLN
INTRAMUSCULAR | Status: AC
  Filled 2019-06-21: qty 100

## 2019-06-21 MED ORDER — ONDANSETRON HCL 4 MG PO TABS: 4.0000 mg | ORAL_TABLET | Freq: Four times a day (QID) | ORAL | Status: DC | PRN

## 2019-06-21 MED ORDER — OXYCODONE HCL 5 MG/5ML PO SOLN: 5.0000 mg | Freq: Once | ORAL | Status: DC | PRN

## 2019-06-21 MED ORDER — MIDAZOLAM HCL 2 MG/2ML IJ SOLN
INTRAMUSCULAR | Status: AC
  Filled 2019-06-21: qty 2

## 2019-06-21 MED ORDER — NALOXONE HCL 0.4 MG/ML IJ SOLN: 0.4000 mg | INTRAMUSCULAR | Status: DC | PRN

## 2019-06-21 MED ORDER — HYDROMORPHONE 1 MG/ML IV SOLN
INTRAVENOUS | Status: DC
  Administered 2019-06-21: 1.4 mg via INTRAVENOUS
  Administered 2019-06-21 (×2): 0.2 mg via INTRAVENOUS
  Administered 2019-06-21: 30 mg via INTRAVENOUS
  Administered 2019-06-22: 0.2 mg via INTRAVENOUS
  Administered 2019-06-22: 0 mg via INTRAVENOUS
  Administered 2019-06-22: 0.2 mg via INTRAVENOUS
  Administered 2019-06-22: 0.4 mg via INTRAVENOUS
  Filled 2019-06-21: qty 30

## 2019-06-21 MED ORDER — VASOPRESSIN 20 UNIT/ML IV SOLN
INTRAVENOUS | Status: AC
  Filled 2019-06-21: qty 1

## 2019-06-21 MED ORDER — LIDOCAINE 2% (20 MG/ML) 5 ML SYRINGE
INTRAMUSCULAR | Status: AC
  Filled 2019-06-21: qty 5

## 2019-06-21 MED ORDER — PROPOFOL 10 MG/ML IV BOLUS
INTRAVENOUS | Status: AC
  Filled 2019-06-21: qty 20

## 2019-06-21 MED ORDER — HEMOSTATIC AGENTS (NO CHARGE) OPTIME
TOPICAL | Status: DC | PRN
  Administered 2019-06-21: 1 via TOPICAL

## 2019-06-21 MED ORDER — ONDANSETRON HCL 4 MG/2ML IJ SOLN
INTRAMUSCULAR | Status: AC
  Filled 2019-06-21: qty 2

## 2019-06-21 MED ORDER — DIPHENHYDRAMINE HCL 50 MG/ML IJ SOLN: 12.5000 mg | Freq: Four times a day (QID) | INTRAMUSCULAR | Status: DC | PRN

## 2019-06-21 MED ORDER — PANTOPRAZOLE SODIUM 40 MG PO TBEC
40.0000 mg | DELAYED_RELEASE_TABLET | Freq: Every day | ORAL | Status: DC
  Administered 2019-06-21 – 2019-06-23 (×3): 40 mg via ORAL
  Filled 2019-06-21 (×3): qty 1

## 2019-06-21 MED ORDER — ONDANSETRON HCL 4 MG/2ML IJ SOLN
INTRAMUSCULAR | Status: DC | PRN
  Administered 2019-06-21 (×2): 4 mg via INTRAVENOUS

## 2019-06-21 MED ORDER — LACTATED RINGERS IV SOLN
INTRAVENOUS | Status: DC | PRN
  Administered 2019-06-21: 07:00:00 via INTRAVENOUS

## 2019-06-21 MED ORDER — SUCCINYLCHOLINE CHLORIDE 200 MG/10ML IV SOSY
PREFILLED_SYRINGE | INTRAVENOUS | Status: AC
  Filled 2019-06-21: qty 10

## 2019-06-21 MED ORDER — LIDOCAINE 2% (20 MG/ML) 5 ML SYRINGE
INTRAMUSCULAR | Status: DC | PRN
  Administered 2019-06-21: 60 mg via INTRAVENOUS

## 2019-06-21 MED ORDER — ROCURONIUM BROMIDE 10 MG/ML (PF) SYRINGE
PREFILLED_SYRINGE | INTRAVENOUS | Status: DC | PRN
  Administered 2019-06-21: 10 mg via INTRAVENOUS
  Administered 2019-06-21: 20 mg via INTRAVENOUS
  Administered 2019-06-21 (×3): 10 mg via INTRAVENOUS
  Administered 2019-06-21: 50 mg via INTRAVENOUS

## 2019-06-21 MED ORDER — PROPOFOL 10 MG/ML IV BOLUS
INTRAVENOUS | Status: DC | PRN
  Administered 2019-06-21: 150 mg via INTRAVENOUS

## 2019-06-21 MED ORDER — ROCURONIUM BROMIDE 10 MG/ML (PF) SYRINGE
PREFILLED_SYRINGE | INTRAVENOUS | Status: AC
  Filled 2019-06-21: qty 10

## 2019-06-21 MED ORDER — DIPHENHYDRAMINE HCL 12.5 MG/5ML PO ELIX: 12.5000 mg | ORAL_SOLUTION | Freq: Four times a day (QID) | ORAL | Status: DC | PRN

## 2019-06-21 MED ORDER — SUGAMMADEX SODIUM 200 MG/2ML IV SOLN
INTRAVENOUS | Status: DC | PRN
  Administered 2019-06-21: 140 mg via INTRAVENOUS

## 2019-06-21 MED ORDER — ESMOLOL HCL 100 MG/10ML IV SOLN
INTRAVENOUS | Status: DC | PRN
  Administered 2019-06-21 (×2): 20 mg via INTRAVENOUS

## 2019-06-21 MED ORDER — BUPIVACAINE HCL (PF) 0.25 % IJ SOLN
INTRAMUSCULAR | Status: AC
  Filled 2019-06-21: qty 30

## 2019-06-21 MED ORDER — OXYCODONE HCL 5 MG PO TABS: 5.0000 mg | ORAL_TABLET | Freq: Once | ORAL | Status: DC | PRN

## 2019-06-21 MED ORDER — ALBUMIN HUMAN 5 % IV SOLN
INTRAVENOUS | Status: DC | PRN
  Administered 2019-06-21 (×2): via INTRAVENOUS

## 2019-06-21 MED ORDER — HEMOSTATIC AGENTS (NO CHARGE) OPTIME: TOPICAL | Status: DC | PRN

## 2019-06-21 MED ORDER — EPHEDRINE SULFATE-NACL 50-0.9 MG/10ML-% IV SOSY
PREFILLED_SYRINGE | INTRAVENOUS | Status: DC | PRN
  Administered 2019-06-21: 5 mg via INTRAVENOUS

## 2019-06-21 MED ORDER — SODIUM CHLORIDE 0.9% FLUSH: 9.0000 mL | INTRAVENOUS | Status: DC | PRN

## 2019-06-21 SURGICAL SUPPLY — 54 items
BARRIER ADHS 3X4 INTERCEED (GAUZE/BANDAGES/DRESSINGS) ×5 IMPLANT
CANISTER SUCT 3000ML PPV (MISCELLANEOUS) ×2 IMPLANT
CONT SPEC PATH 64OZ SNAP LID (MISCELLANEOUS) ×2 IMPLANT
DECANTER SPIKE VIAL GLASS SM (MISCELLANEOUS) ×2 IMPLANT
DERMABOND ADVANCED (GAUZE/BANDAGES/DRESSINGS) ×1
DERMABOND ADVANCED .7 DNX12 (GAUZE/BANDAGES/DRESSINGS) IMPLANT
DRAPE CESAREAN BIRTH W POUCH (DRAPES) ×2 IMPLANT
DRAPE WARM FLUID 44X44 (DRAPES) ×1 IMPLANT
DRSG OPSITE POSTOP 4X10 (GAUZE/BANDAGES/DRESSINGS) ×2 IMPLANT
DURAPREP 26ML APPLICATOR (WOUND CARE) ×2 IMPLANT
ELECT NDL TIP 2.8 STRL (NEEDLE) ×1 IMPLANT
ELECT NEEDLE TIP 2.8 STRL (NEEDLE) ×2 IMPLANT
GAUZE 4X4 16PLY RFD (DISPOSABLE) ×2 IMPLANT
GAUZE SPONGE 4X4 12PLY STRL LF (GAUZE/BANDAGES/DRESSINGS) ×1 IMPLANT
GLOVE BIO SURGEON STRL SZ 6.5 (GLOVE) ×1 IMPLANT
GLOVE BIOGEL PI IND STRL 6.5 (GLOVE) IMPLANT
GLOVE BIOGEL PI IND STRL 7.0 (GLOVE) ×3 IMPLANT
GLOVE BIOGEL PI INDICATOR 6.5 (GLOVE) ×1
GLOVE BIOGEL PI INDICATOR 7.0 (GLOVE) ×3
GLOVE ECLIPSE 6.5 STRL STRAW (GLOVE) ×2 IMPLANT
GOWN STRL REUS W/ TWL LRG LVL3 (GOWN DISPOSABLE) ×3 IMPLANT
GOWN STRL REUS W/TWL LRG LVL3 (GOWN DISPOSABLE) ×3
HEMOSTAT ARISTA ABSORB 3G PWDR (HEMOSTASIS) ×1 IMPLANT
HEMOSTAT SURGICEL 2X14 (HEMOSTASIS) ×1 IMPLANT
KIT TURNOVER KIT B (KITS) ×2 IMPLANT
NDL SPNL 22GX3.5 QUINCKE BK (NEEDLE) ×1 IMPLANT
NEEDLE HYPO 22GX1.5 SAFETY (NEEDLE) ×2 IMPLANT
NEEDLE SPNL 22GX3.5 QUINCKE BK (NEEDLE) ×2 IMPLANT
NS IRRIG 1000ML POUR BTL (IV SOLUTION) ×2 IMPLANT
PACK ABDOMINAL GYN (CUSTOM PROCEDURE TRAY) ×2 IMPLANT
PAD ARMBOARD 7.5X6 YLW CONV (MISCELLANEOUS) ×2 IMPLANT
PAD OB MATERNITY 4.3X12.25 (PERSONAL CARE ITEMS) ×2 IMPLANT
RTRCTR C-SECT PINK 25CM LRG (MISCELLANEOUS) ×2 IMPLANT
SPECIMEN JAR MEDIUM (MISCELLANEOUS) ×2 IMPLANT
SPONGE LAP 18X18 RF (DISPOSABLE) ×5 IMPLANT
STAPLER VISISTAT 35W (STAPLE) IMPLANT
SUT MON AB 2-0 SH 27 (SUTURE) ×10
SUT MON AB 2-0 SH27 (SUTURE) IMPLANT
SUT MON AB 3-0 SH 27 (SUTURE) ×2
SUT MON AB 3-0 SH27 (SUTURE) ×3 IMPLANT
SUT MON AB 4-0 PS1 27 (SUTURE) ×1 IMPLANT
SUT PLAIN 2 0 XLH (SUTURE) IMPLANT
SUT VIC AB 0 CT1 18XCR BRD8 (SUTURE) ×1 IMPLANT
SUT VIC AB 0 CT1 36 (SUTURE) ×4 IMPLANT
SUT VIC AB 0 CT1 8-18 (SUTURE) ×4
SUT VIC AB 2-0 CT1 27 (SUTURE) ×1
SUT VIC AB 2-0 CT1 TAPERPNT 27 (SUTURE) ×1 IMPLANT
SUT VIC AB 2-0 SH 27 (SUTURE) ×3
SUT VIC AB 2-0 SH 27XBRD (SUTURE) IMPLANT
SUT VIC AB 4-0 PS2 27 (SUTURE) ×1 IMPLANT
SUT VICRYL 4-0 PS2 18IN ABS (SUTURE) ×1 IMPLANT
SYR CONTROL 10ML LL (SYRINGE) ×4 IMPLANT
TOWEL GREEN STERILE FF (TOWEL DISPOSABLE) ×4 IMPLANT
TRAY FOLEY W/BAG SLVR 14FR (SET/KITS/TRAYS/PACK) ×2 IMPLANT

## 2019-06-21 NOTE — Anesthesia Postprocedure Evaluation (Signed)
Anesthesia Post Note  Patient: Susan Drake  Procedure(s) Performed: Exploratory Laparotomy MYOMECTOMY (N/A Abdomen)     Patient location during evaluation: PACU Anesthesia Type: General Level of consciousness: awake and alert Pain management: pain level controlled Vital Signs Assessment: post-procedure vital signs reviewed and stable Respiratory status: spontaneous breathing, nonlabored ventilation and respiratory function stable Cardiovascular status: blood pressure returned to baseline and stable Postop Assessment: no apparent nausea or vomiting Anesthetic complications: no    Last Vitals:  Vitals:   06/21/19 1256 06/21/19 1313  BP: 124/70 126/82  Pulse: 76 79  Resp: (!) 21 20  Temp: 37 C 37.1 C  SpO2: 100% 100%    Last Pain:  Vitals:   06/21/19 1313  TempSrc: Oral  PainSc:                  Lidia Collum

## 2019-06-21 NOTE — Transfer of Care (Signed)
Immediate Anesthesia Transfer of Care Note  Patient: Susan Drake  Procedure(s) Performed: Exploratory Laparotomy MYOMECTOMY (N/A Abdomen)  Patient Location: PACU  Anesthesia Type:General  Level of Consciousness: drowsy and patient cooperative  Airway & Oxygen Therapy: Patient Spontanous Breathing  Post-op Assessment: Report given to RN, Post -op Vital signs reviewed and stable and Patient moving all extremities X 4  Post vital signs: Reviewed and stable  Last Vitals:  Vitals Value Taken Time  BP 118/77 06/21/19 1156  Temp    Pulse 80 06/21/19 1159  Resp 22 06/21/19 1159  SpO2 100 % 06/21/19 1159  Vitals shown include unvalidated device data.  Last Pain:  Vitals:   06/21/19 0622  PainSc: 0-No pain      Patients Stated Pain Goal: 3 (21/94/71 2527)  Complications: No apparent anesthesia complications

## 2019-06-21 NOTE — Anesthesia Procedure Notes (Signed)
Procedure Name: Intubation Date/Time: 06/21/2019 8:01 AM Performed by: Julieta Bellini, CRNA Pre-anesthesia Checklist: Patient identified, Emergency Drugs available, Suction available and Patient being monitored Patient Re-evaluated:Patient Re-evaluated prior to induction Oxygen Delivery Method: Circle system utilized Preoxygenation: Pre-oxygenation with 100% oxygen Induction Type: IV induction Ventilation: Mask ventilation without difficulty Laryngoscope Size: Mac and 3 Grade View: Grade I Tube type: Oral Tube size: 7.0 mm Number of attempts: 1 Airway Equipment and Method: Stylet Placement Confirmation: ETT inserted through vocal cords under direct vision,  positive ETCO2 and breath sounds checked- equal and bilateral Secured at: 21 cm Tube secured with: Tape Dental Injury: Teeth and Oropharynx as per pre-operative assessment

## 2019-06-21 NOTE — Brief Op Note (Signed)
06/21/2019  11:48 AM  PATIENT:  Susan Drake  38 y.o. female  PRE-OPERATIVE DIAGNOSIS:  Symptomatic Uterine Fibroids, previous myomectomy  POST-OPERATIVE DIAGNOSIS:  Symptomatic Uterine Fibroids, previous myomectomy  PROCEDURE:  Exp lap, multiple myomectomy  SURGEON:  Surgeon(s) and Role:    * Servando Salina, MD - Primary    * Almquist, Earlyne Iba, MD - Assisting  PHYSICIAN ASSISTANT:   ASSISTANTS: Tiana Loft, MD   ANESTHESIA:   general Findings: multiple SS/IM  Fibroids, periovarian/tubal adhesion EBL:  750 mL   BLOOD ADMINISTERED:none  DRAINS: none   LOCAL MEDICATIONS USED:  MARCAINE     SPECIMEN:  Source of Specimen:  myoma x 21  DISPOSITION OF SPECIMEN:  PATHOLOGY  COUNTS:  YES  TOURNIQUET:  * No tourniquets in log *  DICTATION: .Other Dictation: Dictation Number [id number]  PLAN OF CARE: Admit to inpatient   PATIENT DISPOSITION:  PACU - hemodynamically stable.   Delay start of Pharmacological VTE agent (>24hrs) due to surgical blood loss or risk of bleeding: no

## 2019-06-21 NOTE — Plan of Care (Signed)
  Problem: Education: Goal: Knowledge of the prescribed therapeutic regimen will improve Outcome: Progressing   Problem: Skin Integrity: Goal: Demonstration of wound healing without infection will improve Outcome: Progressing   

## 2019-06-21 NOTE — Anesthesia Preprocedure Evaluation (Signed)
Anesthesia Evaluation  Patient identified by MRN, date of birth, ID band Patient awake    Reviewed: Allergy & Precautions, NPO status , Patient's Chart, lab work & pertinent test results  Airway Mallampati: II  TM Distance: >3 FB Neck ROM: Full    Dental no notable dental hx. (+) Teeth Intact   Pulmonary neg pulmonary ROS, Current Smoker and Patient abstained from smoking., former smoker,    Pulmonary exam normal breath sounds clear to auscultation       Cardiovascular negative cardio ROS Normal cardiovascular exam Rhythm:Regular Rate:Normal     Neuro/Psych negative neurological ROS  negative psych ROS   GI/Hepatic negative GI ROS, Neg liver ROS,   Endo/Other  negative endocrine ROS  Renal/GU negative Renal ROS  negative genitourinary   Musculoskeletal negative musculoskeletal ROS (+)   Abdominal   Peds negative pediatric ROS (+)  Hematology negative hematology ROS (+)   Anesthesia Other Findings   Reproductive/Obstetrics negative OB ROS Fibroid Uterus                             Anesthesia Physical  Anesthesia Plan  ASA: II  Anesthesia Plan: General   Post-op Pain Management:    Induction: Intravenous  PONV Risk Score and Plan: 2 and Ondansetron, Midazolam and Treatment may vary due to age or medical condition  Airway Management Planned: Oral ETT  Additional Equipment:   Intra-op Plan:   Post-operative Plan: Extubation in OR  Informed Consent: I have reviewed the patients History and Physical, chart, labs and discussed the procedure including the risks, benefits and alternatives for the proposed anesthesia with the patient or authorized representative who has indicated his/her understanding and acceptance.     Dental advisory given  Plan Discussed with: Anesthesiologist, CRNA and Surgeon  Anesthesia Plan Comments:         Anesthesia Quick Evaluation

## 2019-06-22 ENCOUNTER — Encounter (HOSPITAL_COMMUNITY): Payer: Self-pay | Admitting: Obstetrics and Gynecology

## 2019-06-22 LAB — CBC
HCT: 27.7 % — ABNORMAL LOW (ref 36.0–46.0)
Hemoglobin: 8.4 g/dL — ABNORMAL LOW (ref 12.0–15.0)
MCH: 24.5 pg — ABNORMAL LOW (ref 26.0–34.0)
MCHC: 30.3 g/dL (ref 30.0–36.0)
MCV: 80.8 fL (ref 80.0–100.0)
Platelets: 129 10*3/uL — ABNORMAL LOW (ref 150–400)
RBC: 3.43 MIL/uL — ABNORMAL LOW (ref 3.87–5.11)
RDW: 14.5 % (ref 11.5–15.5)
WBC: 10.8 10*3/uL — ABNORMAL HIGH (ref 4.0–10.5)
nRBC: 0 % (ref 0.0–0.2)

## 2019-06-22 MED ORDER — WHITE PETROLATUM EX OINT
TOPICAL_OINTMENT | CUTANEOUS | Status: AC
  Administered 2019-06-22: 13:00:00
  Filled 2019-06-22: qty 28.35

## 2019-06-22 NOTE — Progress Notes (Signed)
Subjective: Patient reports incisional pain, tolerating PO, + flatus and no problems voiding.    Objective: I have reviewed patient's vital signs.  vital signs, intake and output and labs. Vitals:   06/22/19 1406 06/22/19 1545  BP: 101/63   Pulse: 67   Resp: 18 16  Temp: 98.3 F (36.8 C)   SpO2: 100% 97%   I/O last 3 completed shifts: In: 4235.8 [P.O.:320; I.V.:3415.8; IV Piggyback:500] Out: RN:[id number]; Blood:750] Total I/O In: 200 [P.O.:200] Out: 900 [Urine:900]  Lab Results  Component Value Date   WBC 10.8 (H) 06/22/2019   HGB 8.4 (L) 06/22/2019   HCT 27.7 (L) 06/22/2019   MCV 80.8 06/22/2019   PLT 129 (L) 06/22/2019   Lab Results  Component Value Date   CREATININE 0.75 12/13/2014    EXAM General: alert, cooperative and no distress Resp: clear to auscultation bilaterally Cardio: regular rate and rhythm, S1, S2 normal, no murmur, click, rub or gallop GI: soft, non-tender; bowel sounds normal; no masses,  no organomegaly and incision: clean, dry and intact Extremities: no edema, redness or tenderness in the calves or thighs Vaginal Bleeding: none  Assessment: s/p Procedure(s): Exploratory Laparotomy MYOMECTOMY: stable, progressing well, tolerating diet and anemia  Plan: Encourage ambulation Advance to PO medication Discontinue IV fluids  LOS: 1 day    Marvene Staff, MD 06/22/2019 6:16 PM    06/22/2019, 6:16 PM

## 2019-06-22 NOTE — Op Note (Signed)
NAME: Susan Drake, Susan Drake MEDICAL RECORD [id number] ACCOUNT [id number] DATE OF BIRTH:1981/03/20 FACILITY: [facility] LOCATION: [facility] PHYSICIAN:Coron Rossano A. Jayde Daffin, MD  OPERATIVE REPORT  DATE OF PROCEDURE:  06/21/2019  PREOPERATIVE DIAGNOSIS:  Symptomatic uterine fibroids, previous myomectomy.  PROCEDURE:  Exploratory laparotomy, multiple myomectomy.  POSTOPERATIVE DIAGNOSIS:  Intramural subserosal fibroids, menorrhagia with regular cycles, iron deficiency anemia, previous myomectomy.  ANESTHESIA:  General.  SURGEON:  Servando Salina, MD  ASSISTANT:  Tiana Loft MD  DESCRIPTION OF PROCEDURE:  Under adequate general anesthesia, the patient was placed in the supine position.  Examination under anesthesia revealed a mobile uterus arising to the umbilicus with palpable multiple fibroids.  The patient was sterilely prepped and draped in the usual fashion.  Indwelling Foley catheter was sterilely placed.  Marcaine 0.25% was injected along the previous Pfannenstiel skin incision site.  Pfannenstiel skin incision was then made, carried down to the rectus fascia.   Rectus fascia was then opened transversely.  The rectus fascia was then bluntly and sharply dissected off the rectus muscle in a superior and inferior fashion.  The rectus muscle was already separated in the midline, therefore, the parietal peritoneum  was opened and incision was extended superiorly and inferiorly.  On entry of the abdomen, uterus with multiple fibroids was noted.  A large posterior pedunculated fibroid of about 9 cm was encountered as was several subserosal fibroids anteriorly and posteriorly.  In addition, there was a right lateral fibroid and left lower uterine segment fibroid and several posterior lower uterine segment fibroids.  Using a dilute solution of Pitressin, the large pedunculated fibroid space was injected and the  fibroid removed.  The posterior fibroids also underwent injection with dilute solution of  Pitressin and a vertical incision was then made and multiple fibroids were removed.  At that point, it was of note that the right tube was twisted on itself with  the ovary and attached right fundal area.  Attempt at partially removing some of the adhesions around ovary and tube did not result in much better plane, therefore, the adnexa was left alone.  The left tube also had peritubal adhesions around the ovary  Once the posterior fibroids was removed through that incision which had been extended vertically, an anterior incision was then made and fibroids were removed as well.  The lower uterine segment fibroid that warranted removal, therefore, the  vesicouterine peritoneum was opened transversely and the bladder was sharply dissected and displaced inferiorly resulting in visualization and mobilization of that fibroid.  Pitressin was injected.  Incision was then made and fibroid enucleated as well.   Once that was done, palpable fibroids anteriorly were removed.  A combination of 0 Vicryl figure-of-eight sutures as well as 3-0 Monocryl sutures were used to close the deep spaces with a Vicryl suture and the serosal surface was 3-0 Monocryl.  Going  back posteriorly, the deep defect of the fibroid removal posteriorly was closed with interrupted 0 Vicryl figure-of-eight sutures.  The serosal surface was closed in a baseball fashion with 3-0 Monocryl suture.  A decision was then made to also remove  fibroids lateral to that incision.  Therefore, again Pitressin injection and incision that encompassed 2 of the fibroid as well as the base of pedunculated fibroid resulted in additional fibroids removed.  As we were doing the removal of these additional posterior fibroids and then lifting up the pedunculated fibroid and allowed for the left lower uterine segment lateral fibroid to be clean and Pitressin again was injected.  Incision made  transversely in that fibroid with a smaller fibroid adjacent to  it was  removed.  That defect was closed in the same manner as all the other had been closed.  Two other smaller fibroids, one was where the left uterosacral ligament and another smaller one below that were left because of the location and inability to comfortably remove those two which were less than 2 cm in size.  All other palpable fibroids were removed.  The left ovary was normal.  Appendix was normal.  Normal liver edge and kidneys were palpable.  Additional sutures were placed in the deep  space due to bleeding.  Abdomen was copiously irrigated and suctioned.  Hemostasis was felt to be adequate.  Surgicel was placed in the left posterior lower uterine segment and Interceed was placed anteriorly and posteriorly.  The parietal peritoneum was  then closed with 2-0 Vicryl.  The rectus fascia was closed with 0 Vicryl x2 and the subcutaneous area was irrigated, small bleeders cauterized.  Interrupted 2-0 plain sutures placed and the skin approximated using 4-0 Vicryl subcuticular closure.   Dermabond was placed on the incision.  SPECIMEN:  Myoma x21.  ESTIMATED BLOOD LOSS:  750 ml.  INTRAOPERATIVE FLUIDS:  2 liters.  The patient also received 2 units of albumin.  URINE OUTPUT:  450 mL clear yellow urine.  SPONGE AND INSTRUMENT COUNTS:  x2 correct.  COMPLICATIONS:  None.  The patient tolerated the procedure well and was transferred to recovery room in stable condition.  TN/NUANCE  D:06/21/2019 T:06/21/2019 JOB:[id number]

## 2019-06-23 MED ORDER — OXYCODONE HCL 5 MG PO TABS: 5.0000 mg | ORAL_TABLET | ORAL | 0 refills | Status: AC | PRN

## 2019-06-23 MED ORDER — POLYSACCHARIDE IRON COMPLEX 150 MG PO CAPS: 150.0000 mg | ORAL_CAPSULE | Freq: Every day | ORAL | 6 refills | Status: DC

## 2019-06-23 MED ORDER — IBUPROFEN 800 MG PO TABS: 800.0000 mg | ORAL_TABLET | Freq: Three times a day (TID) | ORAL | 5 refills | Status: DC | PRN

## 2019-06-23 MED ORDER — POLYSACCHARIDE IRON COMPLEX 150 MG PO CAPS
150.0000 mg | ORAL_CAPSULE | Freq: Every day | ORAL | Status: DC
  Administered 2019-06-23: 150 mg via ORAL
  Filled 2019-06-23: qty 1

## 2019-06-23 NOTE — Plan of Care (Signed)
  Problem: Education: Goal: Understanding of sexual limitations or changes related to disease process or condition will improve Outcome: Progressing   Problem: Self-Concept: Goal: Communication of feelings regarding changes in body function or appearance will improve Outcome: Progressing

## 2019-06-23 NOTE — Progress Notes (Signed)
Subjective: Patient reports incisional pain, tolerating PO, + flatus and no problems voiding. Expects cycle any day now   Objective: I have reviewed patient's vital signs.  vital signs and labs. Vitals:   06/22/19 1955 06/23/19 0416  BP: 111/62 106/70  Pulse: 97 81  Resp: 18 18  Temp: 99.1 F (37.3 C) 99.3 F (37.4 C)  SpO2: 100% 100%   I/O last 3 completed shifts: In: 2135.7 [P.O.:1120; I.V.:1015.7] Out: [id number] [Urine:5575] No intake/output data recorded.  Lab Results  Component Value Date   WBC 10.8 (H) 06/22/2019   HGB 8.4 (L) 06/22/2019   HCT 27.7 (L) 06/22/2019   MCV 80.8 06/22/2019   PLT 129 (L) 06/22/2019   Lab Results  Component Value Date   CREATININE 0.75 12/13/2014    EXAM General: alert, cooperative and no distress Resp: clear to auscultation bilaterally Cardio: regular rate and rhythm, S1, S2 normal, no murmur, click, rub or gallop GI: soft, non-tender; bowel sounds normal; no masses,  no organomegaly and incision: clean, dry and intact Extremities: no edema, redness or tenderness in the calves or thighs Vaginal Bleeding: minimal  Assessment: s/p Procedure(s): Exploratory Laparotomy MYOMECTOMY: stable, progressing well, tolerating diet and anemia  Plan: Encourage ambulation Discontinue IV fluids Discharge home d/c instructions reviewed. remove dressing 3 days  LOS: 2 days    Marvene Staff, MD 06/23/2019 9:44 AM    06/23/2019, 9:44 AM

## 2019-06-23 NOTE — Discharge Instructions (Signed)
Remove dressing on wednesday

## 2019-06-23 NOTE — Progress Notes (Signed)
Susan Drake to be D/C'd  per MD order. Discussed with the patient and all questions fully answered.  VSS, Skin clean, dry and intact without evidence of skin break down, no evidence of skin tears noted.  IV catheter discontinued intact. Site without signs and symptoms of complications. Dressing and pressure applied.  An After Visit Summary was printed and given to the patient. Patient informed where to pickup prescriptions.  D/c education completed with patient/family including follow up instructions, medication list, d/c activities limitations if indicated, with other d/c instructions as indicated by MD - patient able to verbalize understanding, all questions fully answered.   Patient instructed to return to ED, call 911, or call MD for any changes in condition.   Patient to be escorted via [place], and D/C home via private auto.

## 2019-06-23 NOTE — Discharge Summary (Signed)
Physician Discharge Summary  Patient ID: RIEN MCGURL MRN: [id number] DOB/AGE: [birth year] 39 y.o.  Admit date: 06/21/2019 Discharge date: 06/23/2019  Admission Diagnoses: symptomatic uterine fibroids, previous myomectomy  Discharge Diagnoses: same, IDA Active Problems:   Uterine fibroid   S/P myomectomy   Discharged Condition: stable  Hospital Course: pt underwent exp lap, myomectomy.  See operative report Uncomplicated postop course  Consults: None  Significant Diagnostic Studies: labs:  CBC    Component Value Date/Time   WBC 10.8 (H) 06/22/2019 0109   RBC 3.43 (L) 06/22/2019 0109   HGB 8.4 (L) 06/22/2019 0109   HCT 27.7 (L) 06/22/2019 0109   PLT 129 (L) 06/22/2019 0109   MCV 80.8 06/22/2019 0109   MCH 24.5 (L) 06/22/2019 0109   MCHC 30.3 06/22/2019 0109   RDW 14.5 06/22/2019 0109   LYMPHSABS 1.3 04/03/2015 0001   MONOABS 0.7 04/03/2015 0001   EOSABS 0.1 04/03/2015 0001   BASOSABS 0.0 04/03/2015 0001     Treatments: surgery: exp lap, multiple myomectomy  Discharge Exam: Blood pressure 106/70, pulse 81, temperature 99.3 F (37.4 C), temperature source Oral, resp. rate 18, height 5\' 3"  (1.6 m), weight 73.8 kg, last menstrual period 06/02/2019, SpO2 100 %. General appearance: alert, cooperative and no distress Resp: clear to auscultation bilaterally Cardio: regular rate and rhythm, S1, S2 normal, no murmur, click, rub or gallop GI: soft, non-tender; bowel sounds normal; no masses,  no organomegaly Pelvic: deferred Extremities: no edema, redness or tenderness in the calves or thighs Incision/Wound: intact d/c  Disposition: Discharge disposition: 01-Home or Self Care       Discharge Instructions    Call MD for:  persistant nausea and vomiting   Complete by: As directed    Call MD for:  redness, tenderness, or signs of infection (pain, swelling, redness, odor or green/yellow discharge around incision site)   Complete by: As directed    Call MD for:  severe  uncontrolled pain   Complete by: As directed    Call MD for:  temperature >100.4   Complete by: As directed    Diet general   Complete by: As directed    Discharge instructions   Complete by: As directed    Call if temperature greater than equal to 100.4, nothing per vagina for 4-6 weeks or severe nausea vomiting, increased incisional pain , drainage or redness in the incision site, no straining with bowel movements, showers no bath   May walk up steps   Complete by: As directed      Allergies as of 06/23/2019   No Known Allergies     Medication List    TAKE these medications   acetaminophen 500 MG tablet Commonly known as: TYLENOL Take 500 mg by mouth every 6 (six) hours as needed for mild pain.   cholecalciferol 25 MCG (1000 UT) tablet Commonly known as: VITAMIN D3 Take 1,000 Units by mouth daily.   fexofenadine-pseudoephedrine 180-240 MG 24 hr tablet Commonly known as: ALLEGRA-D 24 Take 1 tablet by mouth daily as needed (allergies).   fluticasone 50 MCG/ACT nasal spray Commonly known as: FLONASE Place 2 sprays into both nostrils daily. What changed:   when to take this  reasons to take this   ibuprofen 800 MG tablet Commonly known as: ADVIL Take 1 tablet (800 mg total) by mouth every 8 (eight) hours as needed for moderate pain. What changed:   when to take this  additional instructions   iron polysaccharides 150 MG capsule Commonly known as:  NIFEREX Take 1 capsule (150 mg total) by mouth daily.   oxyCODONE 5 MG immediate release tablet Commonly known as: Oxy IR/ROXICODONE Take 1-2 tablets (5-10 mg total) by mouth every 4 (four) hours as needed for up to 7 days for moderate pain.   PROBIOTIC DAILY PO Take 1 capsule by mouth daily.      Follow-up Information    Servando Salina, MD Follow up in 2 week(s).   Specialty: Obstetrics and Gynecology Contact information: [address] [place] [address] [phone]            Signed: Alanda Slim A Elika Godar 06/23/2019, 9:52 AM

## 2019-07-19 DIAGNOSIS — L239 Allergic contact dermatitis, unspecified cause: Secondary | ICD-10-CM | POA: Insufficient documentation

## 2019-07-31 DIAGNOSIS — L219 Seborrheic dermatitis, unspecified: Secondary | ICD-10-CM | POA: Insufficient documentation

## 2019-09-12 ENCOUNTER — Other Ambulatory Visit: Payer: Self-pay

## 2019-09-12 ENCOUNTER — Ambulatory Visit (INDEPENDENT_AMBULATORY_CARE_PROVIDER_SITE_OTHER): Payer: 59 | Admitting: Medical

## 2019-09-12 VITALS — BP 110/72 | HR 98 | Temp 98.0°F | Ht 62.0 in | Wt 160.4 lb

## 2019-09-12 DIAGNOSIS — T148XXA Other injury of unspecified body region, initial encounter: Secondary | ICD-10-CM | POA: Diagnosis not present

## 2019-09-12 DIAGNOSIS — D649 Anemia, unspecified: Secondary | ICD-10-CM

## 2019-09-12 DIAGNOSIS — L299 Pruritus, unspecified: Secondary | ICD-10-CM | POA: Insufficient documentation

## 2019-09-12 DIAGNOSIS — R21 Rash and other nonspecific skin eruption: Secondary | ICD-10-CM | POA: Insufficient documentation

## 2019-09-12 DIAGNOSIS — Z23 Encounter for immunization: Secondary | ICD-10-CM | POA: Diagnosis not present

## 2019-09-12 DIAGNOSIS — E611 Iron deficiency: Secondary | ICD-10-CM

## 2019-09-12 DIAGNOSIS — R5383 Other fatigue: Secondary | ICD-10-CM

## 2019-09-12 HISTORY — DX: Pruritus, unspecified: L29.9

## 2019-09-12 HISTORY — DX: Rash and other nonspecific skin eruption: R21

## 2019-09-12 NOTE — Progress Notes (Signed)
Subjective: Chief Complaint  Patient presents with  . Allergy Testing    wants referral for rash spots on legs   Here for flu shot  Has concerns about referral to allergist due to rash  Had surgery in august for fibroids.  Had gauze of incisions.  After few days at home, was itchy at incision site.  Was also started on iron at the same time leaving the hospital.   She noticed a rash on her right torso.   Called in to OB/gyn about the rash.  Within a few days, removed the gauze and had dark scabs over al the incisions, was worried about this.   When she went to follow up a week later, OB/gyn gave a cream for the rash, for possible yeast infection.  She used the topical creams.  Used those creams for 2 weeks.    eventually she was itching all over, was seeing rashes on upper inner thighs, rash on proximal upper legs.  She has at this point seen gynecology for 2 follow ups and rash wasn't improving.  She made apt with dermatology at [place].  Diagnosed as dermatitis, allergic reaction to either iodine in surgery or glue from incision or the dressing.  Was prescribed clobetasol cream and tapered prednisone oral for weeks.  Just finished that in late september.     After this treatment dermatology had her switch to triamcinolone cream.  Current symptoms are itching of lower legs just below knees, having bruise marks of lower legs. After scratching the markings get red or bruised appearing, purplish red.   Sometimes these itching is worse after eating , particularly bread or sweets.     Currently taking iron 3 times per week  No family history of autoimmune disease.     Past Medical History:  Diagnosis Date  . Anxiety   . Constipation   . Dermatological disease    sees Dr. Allyson Sabal, bilat upper arms and chest  . Fibroid    Dr. Garwin Brothers  . History of chlamydia    hx/o trichomonas and chlamydia in remote past  . Seasonal allergic rhinitis   . Uterine fibroid    Current Outpatient  Medications on File Prior to Visit  Medication Sig Dispense Refill  . acetaminophen (TYLENOL) 500 MG tablet Take 500 mg by mouth every 6 (six) hours as needed for mild pain.    . cholecalciferol (VITAMIN D3) 25 MCG (1000 UT) tablet Take 1,000 Units by mouth daily.    . fexofenadine-pseudoephedrine (ALLEGRA-D 24) 180-240 MG 24 hr tablet Take 1 tablet by mouth daily as needed (allergies).     . fluticasone (FLONASE) 50 MCG/ACT nasal spray Place 2 sprays into both nostrils daily. (Patient taking differently: Place 2 sprays into both nostrils daily as needed for allergies. ) 16 g 3  . ibuprofen (ADVIL) 800 MG tablet Take 1 tablet (800 mg total) by mouth every 8 (eight) hours as needed for moderate pain. 30 tablet 5  . iron polysaccharides (NIFEREX) 150 MG capsule Take 1 capsule (150 mg total) by mouth daily. 30 capsule 6  . Probiotic Product (PROBIOTIC DAILY PO) Take 1 capsule by mouth daily.      No current facility-administered medications on file prior to visit.     Family History  Problem Relation Age of Onset  . Cancer Mother        non hodgkins lymphoma  . Appendicitis Mother   . COPD Father        emphysema  .  Hypertension Father   . Eczema Father   . Kidney disease Sister   . Cerebral palsy Sister   . Fibroids Sister   . Arthritis Brother   . Cancer Maternal Uncle        liver  . Hypertension Cousin   . Heart disease Neg Hx   . Stroke Neg Hx   . Diabetes Neg Hx     ROS as in subjective    Objective: BP 110/72   Pulse 98   Temp 98 F (36.7 C)   Ht 5\' 2"  (1.575 m)   Wt 160 lb 6.4 oz (72.8 kg)   SpO2 98%   BMI 29.34 kg/m   Wt Readings from Last 3 Encounters:  09/12/19 160 lb 6.4 oz (72.8 kg)  06/21/19 162 lb 11.2 oz (73.8 kg)  06/18/19 162 lb 9.6 oz (73.8 kg)   Gen: wd, wn, nad Skin: proximal lower leg just below knee anteromedial bilat legs with roundish patch of pink/red coloration slight bruising appearance, but no nodules, no obviously rough patch, more  suggestive of faint bruising, along her c-section of lower abdomen is diffuse patch of brown discoloration circumferential around entire c-section scar, outlining the scar by at least 2 cm around perimeter hent unremarkable Lungs clear No joint deformity, no joint swelling    Assessment: Encounter Diagnoses  Name Primary?  . Rash Yes  . Bruising   . Pruritus   . Anemia, unspecified type   . Fatigue, unspecified type   . Iron deficiency   . Need for influenza vaccination     Plan: Discussed her rash, c/o bruising and itching. Discussed differential including autoimmune disease, allergic reaction, environmental allergy, other.  C/t daily antistamine.  Consider trial of hydroxyzine low dose or other lower dose antihistamine as she gets very sedated with current antihistamine xyzal.  C/t daily moisturizing lotion.   Consider cocoa butter to discoloration of skin around C-section scar.    Of note, she has already failed daily triamcinolone cream to abdominal rash per dermatology after initial clobetasol cream. She has also been using daily Aquaphor lotion for weeks.     Consider referral to allergist  Counseled on the influenza virus vaccine.  Vaccine information sheet given.  Influenza vaccine given after consent obtained.   Saysha was seen today for allergy testing.  Diagnoses and all orders for this visit:  Rash -     CBC with Differential -     Ferritin -     Iron and TIBC -     Comprehensive metabolic panel -     SED RATE regular -     TSH regular  Bruising -     CBC with Differential -     Ferritin -     Iron and TIBC -     Comprehensive metabolic panel -     SED RATE regular -     TSH regular  Pruritus -     CBC with Differential -     Ferritin -     Iron and TIBC -     Comprehensive metabolic panel -     SED RATE regular -     TSH regular  Anemia, unspecified type -     CBC with Differential -     Ferritin -     Iron and TIBC -     Comprehensive metabolic  panel -     SED RATE regular -     TSH regular  Fatigue, unspecified  type -     CBC with Differential -     Ferritin -     Iron and TIBC -     Comprehensive metabolic panel -     SED RATE regular -     TSH regular  Iron deficiency -     CBC with Differential -     Ferritin -     Iron and TIBC -     Comprehensive metabolic panel -     SED RATE regular -     TSH regular  Need for influenza vaccination -     Flu Vaccine QUAD 6+ mos PF IM (Fluarix Quad PF)

## 2019-09-13 ENCOUNTER — Other Ambulatory Visit: Payer: Self-pay | Admitting: Medical

## 2019-09-13 LAB — COMPREHENSIVE METABOLIC PANEL
ALT: 11 IU/L (ref 0–32)
AST: 13 IU/L (ref 0–40)
Albumin/Globulin Ratio: 1.5 (ref 1.2–2.2)
Albumin: 4.3 g/dL (ref 3.8–4.8)
Alkaline Phosphatase: 47 IU/L (ref 39–117)
BUN/Creatinine Ratio: 20 (ref 9–23)
BUN: 16 mg/dL (ref 6–20)
Bilirubin Total: 0.2 mg/dL (ref 0.0–1.2)
CO2: 21 mmol/L (ref 20–29)
Calcium: 9.4 mg/dL (ref 8.7–10.2)
Chloride: 105 mmol/L (ref 96–106)
Creatinine, Ser: 0.82 mg/dL (ref 0.57–1.00)
GFR calc Af Amer: 104 mL/min/{1.73_m2} (ref >59)
GFR calc non Af Amer: 90 mL/min/{1.73_m2} (ref >59)
Globulin, Total: 2.8 g/dL (ref 1.5–4.5)
Glucose: 83 mg/dL (ref 65–99)
Potassium: 4.5 mmol/L (ref 3.5–5.2)
Sodium: 139 mmol/L (ref 134–144)
Total Protein: 7.1 g/dL (ref 6.0–8.5)

## 2019-09-13 LAB — IRON AND TIBC
Iron Saturation: 9 % — CL (ref 15–55)
Iron: 34 ug/dL (ref 27–159)
Total Iron Binding Capacity: 376 ug/dL (ref 250–450)
UIBC: 342 ug/dL (ref 131–425)

## 2019-09-13 LAB — CBC WITH DIFFERENTIAL/PLATELET
Basophils Absolute: 0 10*3/uL (ref 0.0–0.2)
Basos: 1 %
EOS (ABSOLUTE): 0 10*3/uL (ref 0.0–0.4)
Eos: 1 %
Hematocrit: 32.8 % — ABNORMAL LOW (ref 34.0–46.6)
Hemoglobin: 9.8 g/dL — ABNORMAL LOW (ref 11.1–15.9)
Immature Grans (Abs): 0 10*3/uL (ref 0.0–0.1)
Immature Granulocytes: 0 %
Lymphocytes Absolute: 2.2 10*3/uL (ref 0.7–3.1)
Lymphs: 34 %
MCH: 20.9 pg — ABNORMAL LOW (ref 26.6–33.0)
MCHC: 29.9 g/dL — ABNORMAL LOW (ref 31.5–35.7)
MCV: 70 fL — ABNORMAL LOW (ref 79–97)
Monocytes Absolute: 0.7 10*3/uL (ref 0.1–0.9)
Monocytes: 11 %
Neutrophils Absolute: 3.5 10*3/uL (ref 1.4–7.0)
Neutrophils: 53 %
Platelets: 265 10*3/uL (ref 150–450)
RBC: 4.68 x10E6/uL (ref 3.77–5.28)
RDW: 15.5 % — ABNORMAL HIGH (ref 11.7–15.4)
WBC: 6.5 10*3/uL (ref 3.4–10.8)

## 2019-09-13 LAB — SEDIMENTATION RATE: Sed Rate: 36 mm/hr — ABNORMAL HIGH (ref 0–32)

## 2019-09-13 LAB — FERRITIN: Ferritin: 7 ng/mL — ABNORMAL LOW (ref 15–150)

## 2019-09-13 LAB — TSH: TSH: 1.76 u[IU]/mL (ref 0.450–4.500)

## 2019-09-13 MED ORDER — POLYSACCHARIDE IRON COMPLEX 150 MG PO CAPS: 150.0000 mg | ORAL_CAPSULE | Freq: Every day | ORAL | 6 refills | Status: DC

## 2019-10-08 ENCOUNTER — Other Ambulatory Visit (INDEPENDENT_AMBULATORY_CARE_PROVIDER_SITE_OTHER): Payer: 59

## 2019-10-08 ENCOUNTER — Other Ambulatory Visit: Payer: Self-pay | Admitting: Medical

## 2019-10-08 ENCOUNTER — Other Ambulatory Visit: Payer: Self-pay

## 2019-10-08 DIAGNOSIS — D649 Anemia, unspecified: Secondary | ICD-10-CM | POA: Diagnosis not present

## 2019-10-08 LAB — POC HEMOCCULT BLD/STL (HOME/3-CARD/SCREEN)
Card #2 Fecal Occult Blod, POC: NEGATIVE
Card #3 Fecal Occult Blood, POC: NEGATIVE
Fecal Occult Blood, POC: NEGATIVE

## 2019-10-23 ENCOUNTER — Other Ambulatory Visit: Payer: Self-pay | Admitting: Medical

## 2019-10-23 DIAGNOSIS — J309 Allergic rhinitis, unspecified: Secondary | ICD-10-CM

## 2020-01-03 ENCOUNTER — Telehealth: Payer: Self-pay

## 2020-01-03 NOTE — Telephone Encounter (Signed)
Pt. Called stating she has an apt. Tomorrow for blood work and wanted to know if you could go ahead and check an UA tomorrow when she comes in as well because she has been having some pain with urination but not that often over the past two weeks.

## 2020-01-03 NOTE — Telephone Encounter (Signed)
That is fine.  She may need to be seen if she has had any acute symptoms where she needs potential treatment, but yes we can run a urinalysis.  When she comes in have her remind whoever brings her back to do a clean-catch urinalysis

## 2020-01-03 NOTE — Telephone Encounter (Signed)
Pt. Is aware she can collect urine tomorrow and to let whoever brings her in to get a clean catch urine.

## 2020-01-04 ENCOUNTER — Other Ambulatory Visit: Payer: Self-pay

## 2020-01-04 ENCOUNTER — Other Ambulatory Visit (INDEPENDENT_AMBULATORY_CARE_PROVIDER_SITE_OTHER): Payer: 59

## 2020-01-04 DIAGNOSIS — D649 Anemia, unspecified: Secondary | ICD-10-CM

## 2020-01-04 LAB — POCT URINALYSIS DIP (PROADVANTAGE DEVICE)
Bilirubin, UA: NEGATIVE
Blood, UA: NEGATIVE
Glucose, UA: NEGATIVE mg/dL
Ketones, POC UA: NEGATIVE mg/dL
Leukocytes, UA: NEGATIVE
Nitrite, UA: NEGATIVE
Protein Ur, POC: NEGATIVE mg/dL
Specific Gravity, Urine: 1.015
Urobilinogen, Ur: NEGATIVE
pH, UA: 7 (ref 5.0–8.0)

## 2020-01-05 LAB — CBC
Hematocrit: 36.7 % (ref 34.0–46.6)
Hemoglobin: 11.1 g/dL (ref 11.1–15.9)
MCH: 23.6 pg — ABNORMAL LOW (ref 26.6–33.0)
MCHC: 30.2 g/dL — ABNORMAL LOW (ref 31.5–35.7)
MCV: 78 fL — ABNORMAL LOW (ref 79–97)
Platelets: 158 10*3/uL (ref 150–450)
RBC: 4.7 x10E6/uL (ref 3.77–5.28)
RDW: 15.7 % — ABNORMAL HIGH (ref 11.7–15.4)
WBC: 6.3 10*3/uL (ref 3.4–10.8)

## 2020-02-28 ENCOUNTER — Telehealth: Payer: Self-pay | Admitting: Medical

## 2020-02-28 NOTE — Telephone Encounter (Signed)
I reviewed her email message.  Lets get her back in for follow-up.  Please make sure we get copies of the gynecology report and ultrasound she had done

## 2020-02-28 NOTE — Telephone Encounter (Signed)
Sent patient a message on mychart to call and schedule appointment. Also inquiring about who her gynecologist is.

## 2020-03-06 LAB — HM PAP SMEAR: HM Pap smear: NEGATIVE

## 2020-03-06 LAB — RESULTS CONSOLE HPV: CHL HPV: NEGATIVE

## 2020-03-18 ENCOUNTER — Encounter: Payer: Self-pay | Admitting: Medical

## 2020-03-26 ENCOUNTER — Encounter: Payer: Self-pay | Admitting: Medical

## 2020-04-04 ENCOUNTER — Ambulatory Visit (INDEPENDENT_AMBULATORY_CARE_PROVIDER_SITE_OTHER): Payer: 59 | Admitting: Medical

## 2020-04-04 ENCOUNTER — Other Ambulatory Visit: Payer: Self-pay

## 2020-04-04 ENCOUNTER — Encounter: Payer: Self-pay | Admitting: Medical

## 2020-04-04 VITALS — BP 110/76 | HR 74 | Ht 62.0 in | Wt 164.6 lb

## 2020-04-04 DIAGNOSIS — Z0000 Encounter for general adult medical examination without abnormal findings: Secondary | ICD-10-CM

## 2020-04-04 DIAGNOSIS — G8929 Other chronic pain: Secondary | ICD-10-CM | POA: Diagnosis not present

## 2020-04-04 DIAGNOSIS — Z9889 Other specified postprocedural states: Secondary | ICD-10-CM

## 2020-04-04 DIAGNOSIS — E559 Vitamin D deficiency, unspecified: Secondary | ICD-10-CM

## 2020-04-04 DIAGNOSIS — N941 Unspecified dyspareunia: Secondary | ICD-10-CM

## 2020-04-04 DIAGNOSIS — R102 Pelvic and perineal pain: Secondary | ICD-10-CM

## 2020-04-04 DIAGNOSIS — R195 Other fecal abnormalities: Secondary | ICD-10-CM

## 2020-04-04 DIAGNOSIS — Z7189 Other specified counseling: Secondary | ICD-10-CM

## 2020-04-04 DIAGNOSIS — N83201 Unspecified ovarian cyst, right side: Secondary | ICD-10-CM

## 2020-04-04 DIAGNOSIS — D259 Leiomyoma of uterus, unspecified: Secondary | ICD-10-CM

## 2020-04-04 DIAGNOSIS — Z7185 Encounter for immunization safety counseling: Secondary | ICD-10-CM

## 2020-04-04 DIAGNOSIS — R109 Unspecified abdominal pain: Secondary | ICD-10-CM

## 2020-04-04 HISTORY — DX: Other specified postprocedural states: Z98.890

## 2020-04-04 HISTORY — DX: Other fecal abnormalities: R19.5

## 2020-04-04 LAB — POCT URINALYSIS DIP (PROADVANTAGE DEVICE)
Bilirubin, UA: NEGATIVE
Blood, UA: NEGATIVE
Glucose, UA: NEGATIVE mg/dL
Ketones, POC UA: NEGATIVE mg/dL
Leukocytes, UA: NEGATIVE
Nitrite, UA: NEGATIVE
Protein Ur, POC: NEGATIVE mg/dL
Specific Gravity, Urine: 1.02
Urobilinogen, Ur: NEGATIVE
pH, UA: 6.5 (ref 5.0–8.0)

## 2020-04-04 NOTE — Progress Notes (Signed)
Subjective:   HPI  Susan Drake is a 39 y.o. female who presents for Chief Complaint  Patient presents with  . Annual Exam    not fasting     Patient Care Team: Sherron Mummert, Camelia Eng, PA-C as PCP - General (Family Medicine) Sees dentist Sees eye doctor  Concerns: Here for preventive care visit.  She recently saw gastroenterology and gynecology for ongoing suprapubic and right lower quadrant abdominal pains.  She does have history of fibroids, had fibroid surgery last year, has a new ovarian cyst is being followed and has follow-up in 1 month.  She also has had anemia but that has improved with iron therapy.  She saw GI recently and they decided not to do a colonoscopy at this point but it is still a potential  Her ongoing pain for months continues to be a sensation of something pulling on her right lower abdomen.  She does get occasional loose stool.  She gets a light brown mild intermittent vaginal discharge without odor.  She does get some pain with intercourse.  She has discussed all this with gastroenterology and gynecology.  Her main concern is in regards to this ongoing pain and occasional urinary pressure  Married, no concern for STD.    Reviewed their medical, surgical, family, social, medication, and allergy history and updated chart as appropriate.   Past Medical History:  Diagnosis Date  . Anxiety   . Constipation    sees Dr. Juanita Craver  . Dermatological disease    sees Dr. Allyson Sabal, bilat upper arms and chest  . History of chlamydia    hx/o trichomonas and chlamydia in remote past  . Seasonal allergic rhinitis   . Uterine fibroid     Past Surgical History:  Procedure Laterality Date  . COLONOSCOPY     no prior colonoscopy  . MYOMECTOMY  05/03/2012   Procedure: MYOMECTOMY;  Surgeon: Marvene Staff, MD;  Location: [facility];  Service: Gynecology;  Laterality: N/A;   Exploratory Laparotomy myomectomy. Open at 1410.  Marland Kitchen MYOMECTOMY  06/21/2019   Exploratory  Laparotomy MYOMECTOMY (N/A Abdomen)  . MYOMECTOMY N/A 06/21/2019   Procedure: Exploratory Laparotomy MYOMECTOMY;  Surgeon: Servando Salina, MD;  Location: [facility];  Service: Gynecology;  Laterality: N/A;  . ROBOT ASSISTED MYOMECTOMY  05/03/2012   Procedure: ROBOTIC ASSISTED MYOMECTOMY;  Surgeon: Marvene Staff, MD;  Location: [facility];  Service: Gynecology;  Laterality: N/A;  Times two fibroids with robot.  . WISDOM TOOTH EXTRACTION  2011    Social History   Socioeconomic History  . Marital status: Married    Spouse name: Not on file  . Number of children: Not on file  . Years of education: Not on file  . Highest education level: Not on file  Occupational History  . Not on file  Tobacco Use  . Smoking status: Light Tobacco Smoker    Types: Cigars  . Smokeless tobacco: Never Used  . Tobacco comment: cigars occaionally  Substance and Sexual Activity  . Alcohol use: Yes    Alcohol/week: 0.0 standard drinks    Comment: social  . Drug use: No  . Sexual activity: Not on file  Other Topics Concern  . Not on file  Social History Narrative   Married 8 years, no children, exercise - some with treadmill, cardio, goes to gym, works at [employer]  04/2020   Social Determinants of Health   Financial Resource Strain:   . Difficulty of Paying Living Expenses:  Food Insecurity:   . Worried About Charity fundraiser in the Last Year:   . Arboriculturist in the Last Year:   Transportation Needs:   . Film/video editor (Medical):   Marland Kitchen Lack of Transportation (Non-Medical):   Physical Activity:   . Days of Exercise per Week:   . Minutes of Exercise per Session:   Stress:   . Feeling of Stress :   Social Connections:   . Frequency of Communication with Friends and Family:   . Frequency of Social Gatherings with Friends and Family:   . Attends Religious Services:   . Active Member of Clubs or Organizations:   . Attends Archivist Meetings:   Marland Kitchen Marital Status:    Intimate Partner Violence:   . Fear of Current or Ex-Partner:   . Emotionally Abused:   Marland Kitchen Physically Abused:   . Sexually Abused:     Family History  Problem Relation Age of Onset  . Cancer Mother        non hodgkins lymphoma  . Appendicitis Mother   . COPD Father        emphysema  . Hypertension Father   . Eczema Father   . Kidney disease Sister   . Cerebral palsy Sister   . Fibroids Sister   . Arthritis Brother   . Cancer Maternal Uncle        liver  . Hypertension Cousin   . Heart disease Neg Hx   . Stroke Neg Hx   . Diabetes Neg Hx      Current Outpatient Medications:  .  cholecalciferol (VITAMIN D3) 25 MCG (1000 UT) tablet, Take 1,000 Units by mouth daily., Disp: , Rfl:  .  fluticasone (FLONASE) 50 MCG/ACT nasal spray, SPRAY 2 SPRAYS INTO EACH NOSTRIL EVERY DAY, Disp: 16 mL, Rfl: 3 .  Probiotic Product (PROBIOTIC DAILY PO), Take 1 capsule by mouth daily. , Disp: , Rfl:  .  iron polysaccharides (NIFEREX) 150 MG capsule, Take 1 capsule (150 mg total) by mouth daily. (Patient not taking: Reported on 04/04/2020), Disp: 30 capsule, Rfl: 6  No Known Allergies    Review of Systems Constitutional: -fever, -chills, -sweats, -unexpected weight change, -decreased appetite, -fatigue Allergy: -sneezing, -itching, -congestion Dermatology: -changing moles, --rash, -lumps ENT: -runny nose, -ear pain, -sore throat, -hoarseness, -sinus pain, -teeth pain, - ringing in ears, -hearing loss, -nosebleeds Cardiology: -chest pain, -palpitations, -swelling, -difficulty breathing when lying flat, -waking up short of breath Respiratory: -cough, -shortness of breath, -difficulty breathing with exercise or exertion, -wheezing, -coughing up blood Gastroenterology: +abdominal pain, +nausea, -vomiting, +loose stools, -constipation, -blood in stool, -changes in bowel movement, -difficulty swallowing or eating Hematology: -bleeding, -bruising  Musculoskeletal: -joint aches, -muscle aches, -joint  swelling, -back pain, -neck pain, -cramping, -changes in gait Ophthalmology: denies vision changes, eye redness, itching, discharge Urology: -burning with urination, -difficulty urinating, -blood in urine, -urinary frequency, -urgency, -incontinence Neurology: -headache, -weakness, -tingling, -numbness, -memory loss, -falls, -dizziness Psychology: -depressed mood, -agitation, -sleep problems Breast/gyn: -breast tendnerss, -discharge, -lumps, -vaginal discharge,- irregular periods, -heavy periods     Objective:  BP 110/76   Pulse 74   Ht 5\' 2"  (1.575 m)   Wt 164 lb 9.6 oz (74.7 kg)   SpO2 95%   BMI 30.11 kg/m   General appearance: alert, no distress, WD/WN, African American female Skin: no worrisome lesions Neck: supple, no lymphadenopathy, no thyromegaly, no masses, normal ROM, no bruits Chest: non tender, normal shape and expansion Heart: RRR,  normal S1, S2, no murmurs Lungs: CTA bilaterally, no wheezes, rhonchi, or rales Abdomen: +bs, soft, +several upper abdominal surgical port scars, mild lower generalized abdominal tendnerss, mild RUQ tennderss, othewise non tender, non distended, no masses, no hepatomegaly, no splenomegaly, no bruits Back: non tender, normal ROM, no scoliosis Musculoskeletal: upper extremities non tender, no obvious deformity, normal ROM throughout, lower extremities non tender, no obvious deformity, normal ROM throughout Extremities: no edema, no cyanosis, no clubbing Pulses: 2+ symmetric, upper and lower extremities, normal cap refill Neurological: alert, oriented x 3, CN2-12 intact, strength normal upper extremities and lower extremities, sensation normal throughout, DTRs 2+ throughout, no cerebellar signs, gait normal Psychiatric: normal affect, behavior normal, pleasant  Breast/gyn/rectal - deferred to gynecology     Assessment and Plan :   Encounter Diagnoses  Name Primary?  . Encounter for health maintenance examination in adult Yes  . Vitamin D  deficiency   . Chronic abdominal pain   . Pelvic pain   . Cyst of right ovary   . H/O abdominal surgery   . Loose stools   . Uterine leiomyoma, unspecified location   . Vaccine counseling   . Dyspareunia, female     Physical exam - discussed and counseled on healthy lifestyle, diet, exercise, preventative care, vaccinations, sick and well care, proper use of emergency dept and after hours care, and addressed their concerns.    I reviewed recent labs in the chart record from May 2021 with hemoglobin A1c 5.2%, glucose 80, insulin normal at 10.4, leptin 108.1, lipids with LDL 101, total cholesterol 165, ferritin low at 13, vitamin D low at 23.6, iron 112 normal, thyroid labs normal, otherwise comprehensive metabolic panel normal, CBC showing low platelets at 140, normal MCV but MCH low at 25.8 hemoglobin and hematocrit normal  Health screening: Advised they see their eye doctor yearly for routine vision care. Advised they see their dentist yearly for routine dental care including hygiene visits twice yearly. See your gynecologist yearly for routine gynecological care.  Cancer screening Counseled on self breast exams, mammograms, cervical cancer screening reivewed pap and mammogram imaging that is up to date  Vaccinations: Advised yearly influenza vaccine Counseled on covid and tetanus vaccines  She will consider   Separate significant chronic issues discussed: Chronic abdominal pain-she recently saw both gynecology and gastroenterology for ongoing pains in the suprapubic and right lower quadrant regions.  Urinalysis reviewed today.  We discussed her recent blood work both at a weight loss clinic that she visited as well as gastroenterology.  She will back off on her iron therapy for a couple weeks and see if the pain resolves completely.  If not we can consider CT abdomen pelvis.  She will let me know.  Vitamin D deficiency-continue vitamin D 2000 units which she recently increased  to  History of iron deficiency anemia-recent iron studies that I reviewed from gastroenterology from May 2021 showed iron much improved, recent hemoglobin back to normal.  She will continue iron every 2 to 3 days for the next month then stop unless she wants to just stop this now.  Her ferritin level has not rebounded back in to a better amount yet but improving  Fibroids-managed by gynecology, status post surgery last year most recently but also other prior fibroid surgeries  Ovarian cyst-managed by gynecology, has follow-up next month   Ivie was seen today for annual exam.  Diagnoses and all orders for this visit:  Encounter for health maintenance examination in adult  Vitamin  D deficiency  Chronic abdominal pain  Pelvic pain  Cyst of right ovary  H/O abdominal surgery  Loose stools  Uterine leiomyoma, unspecified location  Vaccine counseling  Dyspareunia, female    Follow-up yearly for physical

## 2020-04-04 NOTE — Addendum Note (Signed)
Addended by: Carlena Hurl on: 04/04/2020 01:35 PM   Modules accepted: Orders

## 2020-04-08 ENCOUNTER — Encounter: Payer: Self-pay | Admitting: Medical

## 2020-05-08 ENCOUNTER — Encounter: Payer: Self-pay | Admitting: Medical

## 2020-07-05 ENCOUNTER — Encounter (HOSPITAL_COMMUNITY): Payer: Self-pay | Admitting: Emergency Medicine

## 2020-07-05 ENCOUNTER — Other Ambulatory Visit: Payer: Self-pay

## 2020-07-05 ENCOUNTER — Emergency Department (HOSPITAL_COMMUNITY): Payer: 59

## 2020-07-05 ENCOUNTER — Emergency Department (HOSPITAL_COMMUNITY)
Admission: EM | Admit: 2020-07-05 | Discharge: 2020-07-05 | Disposition: A | Discharge status: Home / Self Care | Payer: 59 | Attending: Emergency Medicine | Admitting: Emergency Medicine

## 2020-07-05 DIAGNOSIS — B9689 Other specified bacterial agents as the cause of diseases classified elsewhere: Secondary | ICD-10-CM

## 2020-07-05 DIAGNOSIS — R1032 Left lower quadrant pain: Secondary | ICD-10-CM | POA: Diagnosis present

## 2020-07-05 DIAGNOSIS — N83202 Unspecified ovarian cyst, left side: Secondary | ICD-10-CM | POA: Insufficient documentation

## 2020-07-05 DIAGNOSIS — N939 Abnormal uterine and vaginal bleeding, unspecified: Secondary | ICD-10-CM

## 2020-07-05 DIAGNOSIS — N760 Acute vaginitis: Secondary | ICD-10-CM | POA: Insufficient documentation

## 2020-07-05 DIAGNOSIS — F17290 Nicotine dependence, other tobacco product, uncomplicated: Secondary | ICD-10-CM | POA: Diagnosis not present

## 2020-07-05 DIAGNOSIS — Z79899 Other long term (current) drug therapy: Secondary | ICD-10-CM | POA: Insufficient documentation

## 2020-07-05 LAB — I-STAT BETA HCG BLOOD, ED (MC, WL, AP ONLY): I-stat hCG, quantitative: <5 m[IU]/mL (ref <5)

## 2020-07-05 LAB — URINALYSIS, ROUTINE W REFLEX MICROSCOPIC
Bacteria, UA: NONE SEEN
Bilirubin Urine: NEGATIVE
Glucose, UA: NEGATIVE mg/dL
Ketones, ur: NEGATIVE mg/dL
Leukocytes,Ua: NEGATIVE
Nitrite: NEGATIVE
Protein, ur: NEGATIVE mg/dL
Specific Gravity, Urine: 1.006 (ref 1.005–1.030)
pH: 7 (ref 5.0–8.0)

## 2020-07-05 LAB — CBC
HCT: 41.7 % (ref 36.0–46.0)
Hemoglobin: 12.6 g/dL (ref 12.0–15.0)
MCH: 25.7 pg — ABNORMAL LOW (ref 26.0–34.0)
MCHC: 30.2 g/dL (ref 30.0–36.0)
MCV: 85.1 fL (ref 80.0–100.0)
Platelets: 162 10*3/uL (ref 150–400)
RBC: 4.9 MIL/uL (ref 3.87–5.11)
RDW: 13.6 % (ref 11.5–15.5)
WBC: 6.4 10*3/uL (ref 4.0–10.5)
nRBC: 0 % (ref 0.0–0.2)

## 2020-07-05 LAB — COMPREHENSIVE METABOLIC PANEL
ALT: 11 U/L (ref 0–44)
AST: 19 U/L (ref 15–41)
Albumin: 3.9 g/dL (ref 3.5–5.0)
Alkaline Phosphatase: 32 U/L — ABNORMAL LOW (ref 38–126)
Anion gap: 7 (ref 5–15)
BUN: 10 mg/dL (ref 6–20)
CO2: 26 mmol/L (ref 22–32)
Calcium: 9 mg/dL (ref 8.9–10.3)
Chloride: 104 mmol/L (ref 98–111)
Creatinine, Ser: 0.91 mg/dL (ref 0.44–1.00)
GFR calc Af Amer: >60 mL/min (ref >60)
GFR calc non Af Amer: >60 mL/min (ref >60)
Glucose, Bld: 87 mg/dL (ref 70–99)
Potassium: 4.2 mmol/L (ref 3.5–5.1)
Sodium: 137 mmol/L (ref 135–145)
Total Bilirubin: 0.6 mg/dL (ref 0.3–1.2)
Total Protein: 6.9 g/dL (ref 6.5–8.1)

## 2020-07-05 LAB — WET PREP, GENITAL
Sperm: NONE SEEN
Trich, Wet Prep: NONE SEEN
Yeast Wet Prep HPF POC: NONE SEEN

## 2020-07-05 LAB — LIPASE, BLOOD: Lipase: 27 U/L (ref 11–51)

## 2020-07-05 MED ORDER — FLUCONAZOLE 200 MG PO TABS: ORAL_TABLET | ORAL | 0 refills | Status: DC

## 2020-07-05 MED ORDER — METRONIDAZOLE 500 MG PO TABS: 500.0000 mg | ORAL_TABLET | Freq: Two times a day (BID) | ORAL | 0 refills | Status: DC

## 2020-07-05 MED ORDER — KETOROLAC TROMETHAMINE 60 MG/2ML IM SOLN: 60.0000 mg | Freq: Once | INTRAMUSCULAR | Status: DC

## 2020-07-05 MED ORDER — HYDROCODONE-ACETAMINOPHEN 5-325 MG PO TABS: 1.0000 | ORAL_TABLET | Freq: Four times a day (QID) | ORAL | 0 refills | Status: DC | PRN

## 2020-07-05 NOTE — ED Provider Notes (Signed)
[facility] EMERGENCY DEPARTMENT Provider Note   CSN: [id number] Arrival date & time: 07/05/20  1237     History Chief Complaint  Patient presents with  . Hematuria  . Flank Pain  . Abdominal Pain    Susan Drake is a 39 y.o. female who presents for evaluation of left lower quadrant/left flank pain that began this morning as well as hematuria and possible vaginal bleeding.  She reports that over the last several months, she has had ongoing issue with brown vaginal discharge.  She has seen both her OB/GYN and GI with no conclusive finding.  She was diagnosed with an ovarian cyst and has history of uterine fibroids.  She reports that last week, she had a few episodes of pain in the left lower quadrant but eventually resolved.  She reports that this morning, pain in her left lower quadrant was worse.  It radiated from her flank to her lower abdomen.  She also reported that when she urinated, she had bright red blood noted with the urine.  She does report that since then, she has continued to bleed and has had bright red blood on pads.  She reports her LMP was 06/16/2020.  She is currently sexually active with 1 partner.  She does not have any concerns for STDs.  She states she has had a little discomfort with urination.  She denies any fevers, nausea/vomiting, chest pain, difficulty breathing.  The history is provided by the patient.       Past Medical History:  Diagnosis Date  . Anxiety   . Constipation    sees Dr. Juanita Craver  . Dermatological disease    sees Dr. Allyson Sabal, bilat upper arms and chest  . History of chlamydia    hx/o trichomonas and chlamydia in remote past  . Seasonal allergic rhinitis   . Uterine fibroid     Patient Active Problem List   Diagnosis Date Noted  . Chronic abdominal pain 04/04/2020  . Cyst of right ovary 04/04/2020  . Pelvic pain 04/04/2020  . H/O abdominal surgery 04/04/2020  . Uterine leiomyoma 04/04/2020  . Loose stools 04/04/2020   . Dyspareunia, female 04/04/2020  . Rash 09/12/2019  . Bruising 09/12/2019  . Pruritus 09/12/2019  . Anemia 09/12/2019  . Iron deficiency 09/12/2019  . Uterine fibroid 06/21/2019  . S/P myomectomy 06/21/2019  . Need for influenza vaccination 09/27/2018  . Vaccine counseling 09/27/2018  . Encounter for health maintenance examination in adult 09/27/2018  . Chronic constipation 01/10/2017  . Allergic rhinitis 01/10/2017  . Vitamin D deficiency 01/10/2017  . Fatigue 01/10/2017    Past Surgical History:  Procedure Laterality Date  . COLONOSCOPY     no prior colonoscopy  . MYOMECTOMY  05/03/2012   Procedure: MYOMECTOMY;  Surgeon: Marvene Staff, MD;  Location: [facility];  Service: Gynecology;  Laterality: N/A;   Exploratory Laparotomy myomectomy. Open at 1410.  Marland Kitchen MYOMECTOMY  06/21/2019   Exploratory Laparotomy MYOMECTOMY (N/A Abdomen)  . MYOMECTOMY N/A 06/21/2019   Procedure: Exploratory Laparotomy MYOMECTOMY;  Surgeon: Servando Salina, MD;  Location: [facility];  Service: Gynecology;  Laterality: N/A;  . ROBOT ASSISTED MYOMECTOMY  05/03/2012   Procedure: ROBOTIC ASSISTED MYOMECTOMY;  Surgeon: Marvene Staff, MD;  Location: [facility];  Service: Gynecology;  Laterality: N/A;  Times two fibroids with robot.  . WISDOM TOOTH EXTRACTION  2011     OB History   No obstetric history on file.     Family History  Problem Relation Age of Onset  . Cancer Mother        non hodgkins lymphoma  . Appendicitis Mother   . COPD Father        emphysema  . Hypertension Father   . Eczema Father   . Kidney disease Sister   . Cerebral palsy Sister   . Fibroids Sister   . Arthritis Brother   . Cancer Maternal Uncle        liver  . Hypertension Cousin   . Heart disease Neg Hx   . Stroke Neg Hx   . Diabetes Neg Hx     Social History   Tobacco Use  . Smoking status: Light Tobacco Smoker    Types: Cigars  . Smokeless tobacco: Never Used  . Tobacco comment: cigars occaionally  Vaping  Use  . Vaping Use: Never used  Substance Use Topics  . Alcohol use: Yes    Alcohol/week: 0.0 standard drinks    Comment: social  . Drug use: No    Home Medications Prior to Admission medications   Medication Sig Start Date End Date Taking? Authorizing Provider  cholecalciferol (VITAMIN D3) 25 MCG (1000 UT) tablet Take 1,000 Units by mouth daily.    [provider]  fluconazole (DIFLUCAN) 200 MG tablet Take 1 tablet by mouth after finishing antibiotics. 07/05/20   Volanda Napoleon, PA-C  fluticasone (FLONASE) 50 MCG/ACT nasal spray SPRAY 2 SPRAYS INTO EACH NOSTRIL EVERY DAY 10/23/19   Tysinger, Camelia Eng, PA-C  HYDROcodone-acetaminophen (NORCO/VICODIN) 5-325 MG tablet Take 1-2 tablets by mouth every 6 (six) hours as needed. 07/05/20   Volanda Napoleon, PA-C  iron polysaccharides (NIFEREX) 150 MG capsule Take 1 capsule (150 mg total) by mouth daily. Patient not taking: Reported on 04/04/2020 09/13/19   Tysinger, Camelia Eng, PA-C  metroNIDAZOLE (FLAGYL) 500 MG tablet Take 1 tablet (500 mg total) by mouth 2 (two) times daily. 07/05/20   Volanda Napoleon, PA-C  Probiotic Product (PROBIOTIC DAILY PO) Take 1 capsule by mouth daily.     [provider]    Allergies    Patient has no known allergies.  Review of Systems   Review of Systems  Constitutional: Negative for fever.  Respiratory: Negative for cough and shortness of breath.   Cardiovascular: Negative for chest pain.  Gastrointestinal: Positive for abdominal pain. Negative for nausea and vomiting.  Genitourinary: Positive for flank pain and hematuria.  Neurological: Negative for headaches.  All other systems reviewed and are negative.   Physical Exam Updated Vital Signs BP 119/68 (BP Location: Right Arm)   Pulse 64   Temp 98.5 F (36.9 C) (Oral)   Resp 15   Ht 5\' 5"  (1.651 m)   Wt 68 kg   LMP 06/16/2020   SpO2 100%   BMI 24.96 kg/m   Physical Exam Vitals and nursing note reviewed. Exam conducted with a  chaperone present.  Constitutional:      Appearance: Normal appearance. She is well-developed.     Comments: Sitting comfortably on examination table  HENT:     Head: Normocephalic and atraumatic.  Eyes:     General: Lids are normal.     Conjunctiva/sclera: Conjunctivae normal.     Pupils: Pupils are equal, round, and reactive to light.  Cardiovascular:     Rate and Rhythm: Normal rate and regular rhythm.     Pulses: Normal pulses.     Heart sounds: Normal heart sounds. No murmur Dorsi.  No friction rub. No  gallop.   Pulmonary:     Effort: Pulmonary effort is normal.     Breath sounds: Normal breath sounds.     Comments: Lungs clear to auscultation bilaterally.  Symmetric chest rise.  No wheezing, rales, rhonchi. Abdominal:     Palpations: Abdomen is soft. Abdomen is not rigid.     Tenderness: There is abdominal tenderness in the left lower quadrant. There is left CVA tenderness. There is no guarding.     Comments: Abdomen soft, nondistended.  Mild tenderness in the left mid abdomen  Left-sd CVA tenderness noted.  No rigidity, guarding.ide  Genitourinary:    Vagina: Normal.     Cervix: No cervical motion tenderness.     Adnexa:        Right: No mass or tenderness.         Left: No mass or tenderness.       Comments: The exam was performed with a chaperone present Jodi Marble, RN). Normal external female genitalia. No lesions, rash, or sores. Limited visualization of the cervix secondary to bleeding. No signs of cervical discharged noted. No CMT. No adnexal mass or tenderness noted bilaterally.  Musculoskeletal:        General: Normal range of motion.     Cervical back: Full passive range of motion without pain.  Skin:    General: Skin is warm and dry.     Capillary Refill: Capillary refill takes less than 2 seconds.  Neurological:     Mental Status: She is alert and oriented to person, place, and time.  Psychiatric:        Speech: Speech normal.     ED Results /  Procedures / Treatments   Labs (all labs ordered are listed, but only abnormal results are displayed) Labs Reviewed  WET PREP, GENITAL - Abnormal; Notable for the following components:      Result Value   Clue Cells Wet Prep HPF POC PRESENT (*)    WBC, Wet Prep HPF POC FEW (*)    All other components within normal limits  COMPREHENSIVE METABOLIC PANEL - Abnormal; Notable for the following components:   Alkaline Phosphatase 32 (*)    All other components within normal limits  CBC - Abnormal; Notable for the following components:   MCH 25.7 (*)    All other components within normal limits  URINALYSIS, ROUTINE W REFLEX MICROSCOPIC - Abnormal; Notable for the following components:   Color, Urine STRAW (*)    Hgb urine dipstick LARGE (*)    All other components within normal limits  LIPASE, BLOOD  I-STAT BETA HCG BLOOD, ED ([facility], [facility], [facility] ONLY)  GC/CHLAMYDIA PROBE AMP ([place]) NOT AT [facility]    EKG None  Radiology CT Renal Stone Study  Result Date: 07/05/2020 CLINICAL DATA:  Left flank, left back and left abdominal pain. The patient reports having a left ovarian cyst. EXAM: CT ABDOMEN AND PELVIS WITHOUT CONTRAST TECHNIQUE: Multidetector CT imaging of the abdomen and pelvis was performed following the standard protocol without IV contrast. COMPARISON:  Pelvis MR dated 01/21/2012 FINDINGS: Lower chest: No acute abnormality. Hepatobiliary: Oval low density mass in the anterior segment of the right lobe of the liver, measuring 3.4 x 3.0 cm on image number 26 series 3. There is also a 1.1 cm oval similar appearing mass in the dome of the liver medially on the right on coronal image number 30 series 6. There are also 4 small cysts in the liver. Poorly visualized sludge or noncalcified gallstones  in the gallbladder. No gallbladder wall thickening or pericholecystic fluid. Pancreas: Unremarkable. No pancreatic ductal dilatation or surrounding inflammatory changes. Spleen: Normal in size without focal  abnormality. Adrenals/Urinary Tract: Adrenal glands are unremarkable. Kidneys are normal, without renal calculi, focal lesion, or hydronephrosis. Bladder is unremarkable. Stomach/Bowel: Small hiatal hernia. Mild colonic diverticulosis, including the sigmoid and right colon. Normal appearing appendix. Vascular/Lymphatic: No significant vascular findings are present. No enlarged abdominal or pelvic lymph nodes. Reproductive: Diffusely enlarged uterus containing poorly visualized previously demonstrated fibroids. The previously demonstrated very large fundal fibroid is no longer seen. 2.1 cm simple appearing left ovarian cyst compatible with an ovarian follicular cyst. Other: Small to moderate amount of simple fluid in the pelvic cul-de-sac. Musculoskeletal: Mild lumbar and lower thoracic spine degenerative changes. IMPRESSION: 1. No acute abnormality. 2. 3.4 x 3.0 cm and 1.1 cm liver masses, as described above. These are nonspecific, possibly representing adenomas or hemangiomas. Further evaluation with elective outpatient MRI of the liver without and with contrast is recommended. 3. Poorly visualized sludge or noncalcified gallstones in the gallbladder without evidence of cholecystitis. 4. Small hiatal hernia. 5. Mild colonic diverticulosis. 6. Small to moderate amount of fluid in the pelvic cul-de-sac, most likely due to recent follicle rupture. Electronically Signed   By: Claudie Revering M.D.   On: 07/05/2020 17:12    Procedures Procedures (including critical care time)  Medications Ordered in ED Medications  ketorolac (TORADOL) injection 60 mg (60 mg Intramuscular Not Given 07/05/20 1654)    ED Course  I have reviewed the triage vital signs and the nursing notes.  Pertinent labs & imaging results that were available during my care of the patient were reviewed by me and considered in my medical decision making (see chart for details).    MDM Rules/Calculators/A&P                          39 year old  female who presents for evaluation of left lower quadrant abdominal pain, hematuria/possible vaginal bleeding.  Symptoms started this morning.  No associated fever, nausea/vomiting.  History of brown vaginal discharge over the last several months and has been followed by both OB/GYN and GI for evaluation.  She is not having any rectal bleeding, fevers, nausea/vomiting.  On initially arrival, she is afebrile, nontoxic-appearing.  Vital signs are stable.  On exam, she has left-sided CVA tenderness as well as some left lower quadrant abdominal pain.  Question if this is abnormal uterine bleeding versus UTI vs kidney stone.  We will plan for labs. Patient is very well-appearing. History/physical exam not concerning for ovarian torsion.  CMP shows normal BUN and creatinine.  I-STAT beta is negative.  CBC shows no leukocytosis or anemia.  UA shows no evidence of infectious etiology.  There is hemoglobin noted in the urine.  CT renal study shows no evidence of kidney stone. She has 2.1 cm simple appearing left ovarian cyst compatible with ovarian follicular cyst. She has enlarged uterus containing poorly visualized previously demonstrated fibroids. There is a small to moderate amount of fluid in pelvic cul-de-sac most likely due to recent follicle rupture. She also has an incidental finding of a 3 x 3 cm and 1 cm liver masses that are nonspecific but could represent adenomas versus hemangiomas. Further evaluation with outpatient MRI is recommended.  Pelvic exam as documented above. Patient did have bleeding noted in the vaginal vault. No cervical discharge. No CMT. No adnexal mass or tenderness noted bilaterally. Exam is  not concerning for PID.  At this time, patient is resting comfortably. She is in no signs of distress. She had no adnexal tenderness on noted on my pelvic exam. I discussed with her that her exam is not concerning for ovarian torsion. Additionally, she has no signs of of secondary inflammation of  the ovary on her CT scan. I suspect her symptoms are likely related to ovarian cyst/recent ruptured follicular cyst. Additionally, uterine fibroids can cause abnormal uterine bleeding and pain. At this time, no indication for ultrasound has do not suspect ovarian torsion as etiology of her symptoms. I did discuss this all in detail with patient. We engaged in shared decision making I did offer her ultrasound evaluation but after discussing with patient, she declined ultrasound which I feel is reasonable given her reassuring exam. I also discussed with patient regarding findings on CT scan. Patient is already established with a GI doctor. I instructed her to discuss with her GI doctor regarding these findings. Wet prep shows clue cells. No yeast, trichomonas. Discussed this with patient and she would prefer to treat.   Patient instructed to follow-up with her OB/GYN as well as her GI doctor. Patient given short course of pain medication for acute pain. At this time, patient exhibits no emergent life-threatening condition that require further evaluation in ED or admission. Patient had ample opportunity for questions and discussion. All patient's questions were answered with full understanding. Strict return precautions discussed. Patient expresses understanding and agreement to plan.   Portions of this note were generated with [employer]. Dictation errors may occur despite best attempts at proofreading.   Final Clinical Impression(s) / ED Diagnoses Final diagnoses:  Abnormal uterine bleeding  Cyst of left ovary  BV (bacterial vaginosis)    Rx / DC Orders ED Discharge Orders         Ordered    HYDROcodone-acetaminophen (NORCO/VICODIN) 5-325 MG tablet  Every 6 hours PRN        07/05/20 1802    metroNIDAZOLE (FLAGYL) 500 MG tablet  2 times daily        07/05/20 1808    fluconazole (DIFLUCAN) 200 MG tablet        07/05/20 1808           Volanda Napoleon, PA-C 07/05/20 [place], [place], DO 07/05/20 1913

## 2020-07-05 NOTE — ED Triage Notes (Signed)
Pt reports L flank pain that radiates to LLQ and hematuria since this morning.  States she had dull L sided pain a few weeks ago.  Denies fever and chills.  Denies nausea, vomiting, and diarrhea.  Last BM today- soft.

## 2020-07-05 NOTE — Discharge Instructions (Signed)
As we discussed, he your CT scan did show evidence of ovarian cyst as well as some uterine fibroids. This is most likely contributing to your symptoms. Your blood work today was reassuring and there is no evidence of infection in your urine.  As we discussed, your CT scan did show some abnormalities of the liver. There was areas of about 1 cm in 3 cm masses that could not be quite characterized on CT scan. This just needs further evaluation by an MRI. Your GI doctor should be notified of this and can arrange for further evaluation of these.  You can take Tylenol or Ibuprofen as directed for pain. You can alternate Tylenol and Ibuprofen every 4 hours. If you take Tylenol at 1pm, then you can take Ibuprofen at 5pm. Then you can take Tylenol again at 9pm.   Take pain medications as directed for break through pain. Do not drive or operate machinery while taking this medication.   You have gonorrhea/chlamydia cultures pending. If you are positive, they will notify you. If you are negative, you most likely will not be notified. These will take about 2 days to come back. You can check online or on the [facility] app regarding your results.  Return the emergency department for any worsening pain, vomiting, fever, difficulty breathing or any other worsening concerning symptoms.

## 2020-07-08 LAB — GC/CHLAMYDIA PROBE AMP (~~LOC~~) NOT AT ARMC
Chlamydia: NEGATIVE
Comment: NEGATIVE
Comment: NORMAL
Neisseria Gonorrhea: NEGATIVE

## 2020-07-22 ENCOUNTER — Other Ambulatory Visit: Payer: Self-pay | Admitting: Gastroenterology

## 2020-07-22 DIAGNOSIS — R160 Hepatomegaly, not elsewhere classified: Secondary | ICD-10-CM

## 2020-07-22 DIAGNOSIS — R9389 Abnormal findings on diagnostic imaging of other specified body structures: Secondary | ICD-10-CM

## 2020-08-01 ENCOUNTER — Other Ambulatory Visit (HOSPITAL_COMMUNITY): Payer: Self-pay | Admitting: Gastroenterology

## 2020-08-01 ENCOUNTER — Other Ambulatory Visit: Payer: Self-pay | Admitting: Gastroenterology

## 2020-08-01 DIAGNOSIS — R9389 Abnormal findings on diagnostic imaging of other specified body structures: Secondary | ICD-10-CM

## 2020-08-01 DIAGNOSIS — R160 Hepatomegaly, not elsewhere classified: Secondary | ICD-10-CM

## 2020-08-12 ENCOUNTER — Ambulatory Visit (HOSPITAL_COMMUNITY)
Admission: RE | Admit: 2020-08-12 | Discharge: 2020-08-12 | Disposition: A | Discharge status: Home / Self Care | Payer: 59 | Source: Ambulatory Visit | Attending: Gastroenterology | Admitting: Gastroenterology

## 2020-08-12 ENCOUNTER — Other Ambulatory Visit: Payer: Self-pay

## 2020-08-12 DIAGNOSIS — R9389 Abnormal findings on diagnostic imaging of other specified body structures: Secondary | ICD-10-CM | POA: Diagnosis present

## 2020-08-12 DIAGNOSIS — R160 Hepatomegaly, not elsewhere classified: Secondary | ICD-10-CM | POA: Diagnosis present

## 2020-08-12 MED ORDER — GADOBUTROL 1 MMOL/ML IV SOLN
6.8000 mL | Freq: Once | INTRAVENOUS | Status: AC | PRN
  Administered 2020-08-12: 6.8 mL via INTRAVENOUS

## 2020-08-19 ENCOUNTER — Other Ambulatory Visit: Payer: 59

## 2020-08-19 ENCOUNTER — Other Ambulatory Visit: Payer: Self-pay | Admitting: Gastroenterology

## 2020-08-19 DIAGNOSIS — R1011 Right upper quadrant pain: Secondary | ICD-10-CM

## 2020-08-26 ENCOUNTER — Ambulatory Visit
Admission: RE | Admit: 2020-08-26 | Discharge: 2020-08-26 | Disposition: A | Discharge status: Home / Self Care | Payer: 59 | Source: Ambulatory Visit | Attending: Gastroenterology | Admitting: Gastroenterology

## 2020-08-26 DIAGNOSIS — R1011 Right upper quadrant pain: Secondary | ICD-10-CM

## 2020-09-01 ENCOUNTER — Encounter: Payer: Self-pay | Admitting: Medical

## 2020-12-04 ENCOUNTER — Other Ambulatory Visit: Payer: Self-pay | Admitting: Obstetrics and Gynecology

## 2020-12-04 DIAGNOSIS — R5381 Other malaise: Secondary | ICD-10-CM

## 2020-12-12 ENCOUNTER — Other Ambulatory Visit: Payer: Self-pay | Admitting: Obstetrics and Gynecology

## 2020-12-12 DIAGNOSIS — N644 Mastodynia: Secondary | ICD-10-CM

## 2021-01-05 ENCOUNTER — Ambulatory Visit
Admission: RE | Admit: 2021-01-05 | Discharge: 2021-01-05 | Disposition: A | Discharge status: Home / Self Care | Payer: 59 | Source: Ambulatory Visit | Attending: Obstetrics and Gynecology | Admitting: Obstetrics and Gynecology

## 2021-01-05 ENCOUNTER — Other Ambulatory Visit: Payer: Self-pay

## 2021-01-05 DIAGNOSIS — N644 Mastodynia: Secondary | ICD-10-CM

## 2021-07-01 ENCOUNTER — Other Ambulatory Visit: Payer: Self-pay

## 2021-07-01 ENCOUNTER — Encounter: Payer: Self-pay | Admitting: Medical

## 2021-07-01 ENCOUNTER — Ambulatory Visit (INDEPENDENT_AMBULATORY_CARE_PROVIDER_SITE_OTHER): Payer: 59 | Admitting: Medical

## 2021-07-01 VITALS — BP 120/70 | HR 66 | Wt 162.0 lb

## 2021-07-01 DIAGNOSIS — D1803 Hemangioma of intra-abdominal structures: Secondary | ICD-10-CM

## 2021-07-01 DIAGNOSIS — K7689 Other specified diseases of liver: Secondary | ICD-10-CM

## 2021-07-01 DIAGNOSIS — D259 Leiomyoma of uterus, unspecified: Secondary | ICD-10-CM

## 2021-07-01 DIAGNOSIS — K629 Disease of anus and rectum, unspecified: Secondary | ICD-10-CM

## 2021-07-01 DIAGNOSIS — K6289 Other specified diseases of anus and rectum: Secondary | ICD-10-CM | POA: Diagnosis not present

## 2021-07-01 DIAGNOSIS — G8929 Other chronic pain: Secondary | ICD-10-CM

## 2021-07-01 DIAGNOSIS — R109 Unspecified abdominal pain: Secondary | ICD-10-CM | POA: Diagnosis not present

## 2021-07-01 DIAGNOSIS — K5790 Diverticulosis of intestine, part unspecified, without perforation or abscess without bleeding: Secondary | ICD-10-CM | POA: Diagnosis not present

## 2021-07-01 NOTE — Progress Notes (Signed)
Subjective:  Susan Drake is a 40 y.o. female who presents for Chief Complaint  Patient presents with   wants a new GI    Wants a new GI- currently seeing Dr. Collene Mares, wants to go to [facility] on [address]- liver issues due to ER back in september     Here for discussion of need for another opinion, referral to different GI office  She would like to go see [facility] on [facility].  She has been seeing Dr. Mickel Duhamel for the last several years.  She feels like their office can be awfully rushed where she does not feel like she gets her questions answered.  She would like to try different office.  She has history of chronic bowel issues, chronic abdominal pain.  In the past year she had issues with having to use a lot of toilet paper to wipe to feel clean.  She also uses wet wipes as well.  There was a concern for decreased rectal tone at 1 point.  She did physical therapy [facility] for several months which really helped.  She was having a lot of problems with feeling large volumes of stool but lately that has not been an issue.  She has a history of uterine fibroids, sees gynecology.  Had a surgery last year for fibroids.  Last year she was having some urinary symptoms despite negative urinalysis and urine culture eval.  No recent problems at this  She does get occasional bruising that seems unusual to her.  She notes some varicose veins in the legs mild.  They have gotten a little more noticeable though  No other aggravating or relieving factors.    No other c/o.  Past Medical History:  Diagnosis Date   Anxiety    Constipation    sees Dr. Juanita Craver   Dermatological disease    sees Dr. Allyson Sabal, bilat upper arms and chest   History of chlamydia    hx/o trichomonas and chlamydia in remote past   Seasonal allergic rhinitis    Uterine fibroid    Current Outpatient Medications on File Prior to Visit   Medication Sig Dispense Refill   cholecalciferol (VITAMIN D3) 25 MCG (1000 UT) tablet Take 1,000 Units by mouth daily.     fluticasone (FLONASE) 50 MCG/ACT nasal spray SPRAY 2 SPRAYS INTO EACH NOSTRIL EVERY DAY 16 mL 3   Probiotic Product (PROBIOTIC DAILY PO) Take 1 capsule by mouth daily.      ibuprofen (ADVIL) 800 MG tablet Take 800 mg by mouth every 8 (eight) hours as needed.     No current facility-administered medications on file prior to visit.     The following portions of the patient's history were reviewed and updated as appropriate: allergies, current medications, past family history, past medical history, past social history, past surgical history and problem list.  ROS Otherwise as in subjective above  Objective: BP 120/70   Pulse 66   Wt 162 lb (73.5 kg)   BMI 26.96 kg/m    General appearance: alert, no distress, well developed, well nourished Otherwise not examined   Assessment: Encounter Diagnoses  Name Primary?   Chronic abdominal pain Yes   Rectal abnormality    Decreased rectal sphincter tone    Diverticulosis    Liver hemangioma    Liver cyst    Uterine leiomyoma, unspecified location      Plan: Referral to [facility]  [facility] at her request for consult about her ongoing issues.  Overall she is doing relatively well currently but in the last year has had problems with stool changes, constipation, rectal issues, but after several months of pelvic floor physical therapy seems to be doing relatively well at the moment  We discussed her MRI and CT abdomen from this past year and those findings including benign liver hemangioma, very small liver cyst, diverticulosis without diverticulitis, and uterine fibroids.   Early was seen today for wants a new gi.  Diagnoses and all orders for this visit:  Chronic abdominal pain -     Ambulatory referral to Gastroenterology  Rectal abnormality -     Ambulatory referral to Gastroenterology  Decreased  rectal sphincter tone -     Ambulatory referral to Gastroenterology  Diverticulosis -     Ambulatory referral to Gastroenterology  Liver hemangioma -     Ambulatory referral to Gastroenterology  Liver cyst -     Ambulatory referral to Gastroenterology  Uterine leiomyoma, unspecified location   Follow up: pending GI consult, October for physical here with me

## 2021-08-21 ENCOUNTER — Encounter: Payer: Self-pay | Admitting: Medical

## 2021-08-21 ENCOUNTER — Ambulatory Visit (INDEPENDENT_AMBULATORY_CARE_PROVIDER_SITE_OTHER): Payer: 59 | Admitting: Medical

## 2021-08-21 ENCOUNTER — Other Ambulatory Visit: Payer: Self-pay

## 2021-08-21 VITALS — BP 120/80 | HR 71 | Ht 63.0 in | Wt 162.4 lb

## 2021-08-21 DIAGNOSIS — E559 Vitamin D deficiency, unspecified: Secondary | ICD-10-CM

## 2021-08-21 DIAGNOSIS — J309 Allergic rhinitis, unspecified: Secondary | ICD-10-CM

## 2021-08-21 DIAGNOSIS — G8929 Other chronic pain: Secondary | ICD-10-CM

## 2021-08-21 DIAGNOSIS — Z9889 Other specified postprocedural states: Secondary | ICD-10-CM | POA: Diagnosis not present

## 2021-08-21 DIAGNOSIS — Z2820 Immunization not carried out because of patient decision for unspecified reason: Secondary | ICD-10-CM | POA: Insufficient documentation

## 2021-08-21 DIAGNOSIS — D649 Anemia, unspecified: Secondary | ICD-10-CM

## 2021-08-21 DIAGNOSIS — Z0000 Encounter for general adult medical examination without abnormal findings: Secondary | ICD-10-CM | POA: Diagnosis not present

## 2021-08-21 DIAGNOSIS — Z7185 Encounter for immunization safety counseling: Secondary | ICD-10-CM | POA: Diagnosis not present

## 2021-08-21 DIAGNOSIS — D1803 Hemangioma of intra-abdominal structures: Secondary | ICD-10-CM

## 2021-08-21 DIAGNOSIS — K7689 Other specified diseases of liver: Secondary | ICD-10-CM

## 2021-08-21 DIAGNOSIS — Z1159 Encounter for screening for other viral diseases: Secondary | ICD-10-CM | POA: Insufficient documentation

## 2021-08-21 DIAGNOSIS — E611 Iron deficiency: Secondary | ICD-10-CM

## 2021-08-21 DIAGNOSIS — Z113 Encounter for screening for infections with a predominantly sexual mode of transmission: Secondary | ICD-10-CM | POA: Insufficient documentation

## 2021-08-21 DIAGNOSIS — R109 Unspecified abdominal pain: Secondary | ICD-10-CM

## 2021-08-21 DIAGNOSIS — K5909 Other constipation: Secondary | ICD-10-CM

## 2021-08-21 DIAGNOSIS — K6289 Other specified diseases of anus and rectum: Secondary | ICD-10-CM

## 2021-08-21 DIAGNOSIS — Z720 Tobacco use: Secondary | ICD-10-CM | POA: Insufficient documentation

## 2021-08-21 DIAGNOSIS — K5790 Diverticulosis of intestine, part unspecified, without perforation or abscess without bleeding: Secondary | ICD-10-CM

## 2021-08-21 DIAGNOSIS — T148XXA Other injury of unspecified body region, initial encounter: Secondary | ICD-10-CM

## 2021-08-21 NOTE — Progress Notes (Addendum)
Subjective:   HPI  Susan Drake is a 40 y.o. female who presents for Chief Complaint  Patient presents with   fasting cpe     Fasting cpe, see obgyn. [facility]    Patient Care Team: Elijah Phommachanh, Leward Quan as PCP - General (Family Medicine) Sees dentist Sees eye doctor [facility] Dermatology Dr. Juanita Craver, gastroenterology Rolene Course, NP, gastroenterology Dr. Servando Salina, gynecology  Concerns: She has pictures of random bruisies of her legs.  None today but just pics on phone I observed  She continues to see GI, working on bulking up on fiber.  May have colonoscopy planned after first of year.  Reviewed their medical, surgical, family, social, medication, and allergy history and updated chart as appropriate.  Past Medical History:  Diagnosis Date   Anxiety    Constipation    sees Dr. Juanita Craver   Dermatological disease    sees Dr. Allyson Sabal, bilat upper arms and chest   History of chlamydia    hx/o trichomonas and chlamydia in remote past   Seasonal allergic rhinitis    Uterine fibroid     Family History  Problem Relation Age of Onset   Cancer Mother        non hodgkins lymphoma   Appendicitis Mother    COPD Father        emphysema   Hypertension Father    Eczema Father    Kidney disease Sister    Cerebral palsy Sister    Fibroids Sister    Arthritis Brother    Cancer Maternal Uncle        liver   Hypertension Cousin    Breast cancer Paternal Grandmother    Heart disease Neg Hx    Stroke Neg Hx    Diabetes Neg Hx      Current Outpatient Medications:    cholecalciferol (VITAMIN D3) 25 MCG (1000 UT) tablet, Take 1,000 Units by mouth daily., Disp: , Rfl:    fluticasone (FLONASE) 50 MCG/ACT nasal spray, SPRAY 2 SPRAYS INTO EACH NOSTRIL EVERY DAY, Disp: 16 mL, Rfl: 3   ibuprofen (ADVIL) 800 MG tablet, Take 800 mg by mouth every 8 (eight) hours as needed., Disp: , Rfl:    Probiotic Product (PROBIOTIC DAILY PO), Take 1  capsule by mouth daily. , Disp: , Rfl:   No Known Allergies  Past Surgical History:  Procedure Laterality Date   BREAST BIOPSY Left    COLONOSCOPY     no prior colonoscopy   MYOMECTOMY  05/03/2012   Procedure: MYOMECTOMY;  Surgeon: Marvene Staff, MD;  Location: [facility];  Service: Gynecology;  Laterality: N/A;   Exploratory Laparotomy myomectomy. Open at 1410.   MYOMECTOMY  06/21/2019   Exploratory Laparotomy MYOMECTOMY (N/A Abdomen)   MYOMECTOMY N/A 06/21/2019   Procedure: Exploratory Laparotomy MYOMECTOMY;  Surgeon: Servando Salina, MD;  Location: [facility];  Service: Gynecology;  Laterality: N/A;   ROBOT ASSISTED MYOMECTOMY  05/03/2012   Procedure: ROBOTIC ASSISTED MYOMECTOMY;  Surgeon: Marvene Staff, MD;  Location: [facility];  Service: Gynecology;  Laterality: N/A;  Times two fibroids with robot.   WISDOM TOOTH EXTRACTION  2011     Review of Systems Constitutional: -fever, -chills, -sweats, -unexpected weight change, -decreased appetite, -fatigue Allergy: -sneezing, -itching, -congestion Dermatology: -changing moles, --rash, -lumps ENT: -runny nose, -ear pain, -sore throat, -hoarseness, -sinus pain, -teeth pain, - ringing in ears, -hearing loss, -nosebleeds Cardiology: -chest pain, -palpitations, -swelling, -difficulty breathing when lying flat, -  waking up short of breath Respiratory: -cough, -shortness of breath, -difficulty breathing with exercise or exertion, -wheezing, -coughing up blood Gastroenterology: +abdominal pain, -nausea, -vomiting, -diarrhea, +constipation, -blood in stool, -changes in bowel movement, -difficulty swallowing or eating Hematology: -bleeding, -bruising  Musculoskeletal: -joint aches, -muscle aches, -joint swelling, -back pain, -neck pain, -cramping, -changes in gait Ophthalmology: denies vision changes, eye redness, itching, discharge Urology: -burning with urination, -difficulty urinating, -blood in urine, -urinary frequency, -urgency,  -incontinence Neurology: -headache, -weakness, -tingling, -numbness, -memory loss, -falls, -dizziness Psychology: -depressed mood, -agitation, -sleep problems Breast/gyn: -breast tendnerss, -discharge, -lumps, -vaginal discharge,- irregular periods, -heavy periods   Depression screen [facility] 2/9 08/21/2021 07/01/2021 04/04/2020  Decreased Interest 0 0 0  Down, Depressed, Hopeless 0 0 0  PHQ - 2 Score 0 0 0       Objective:  BP 120/80   Pulse 71   Ht 5\' 3"  (1.6 m)   Wt 162 lb 6.4 oz (73.7 kg)   BMI 28.77 kg/m   Wt Readings from Last 3 Encounters:  08/21/21 162 lb 6.4 oz (73.7 kg)  07/01/21 162 lb (73.5 kg)  07/05/20 150 lb (68 kg)    General appearance: alert, no distress, WD/WN, African American female Skin: unremarkable, but she had pictures showing few scattered purplish faint bruising on lower legs anteriorly HEENT: normocephalic, conjunctiva/corneas normal, sclerae anicteric, PERRLA, EOMi Neck: supple, no lymphadenopathy, no thyromegaly, no masses, normal ROM, no bruits Chest: non tender, normal shape and expansion Heart: RRR, normal S1, S2, no murmurs Lungs: CTA bilaterally, no wheezes, rhonchi, or rales Abdomen: +bs, soft, several surgical port scars, non tender, non distended, no masses, no hepatomegaly, no splenomegaly, no bruits Back: non tender, normal ROM, no scoliosis Musculoskeletal: upper extremities non tender, no obvious deformity, normal ROM throughout, lower extremities non tender, no obvious deformity, normal ROM throughout Extremities: no edema, no cyanosis, no clubbing Pulses: 2+ symmetric, upper and lower extremities, normal cap refill Neurological: alert, oriented x 3, CN2-12 intact, strength normal upper extremities and lower extremities, sensation normal throughout, DTRs 2+ throughout, no cerebellar signs, gait normal Psychiatric: normal affect, behavior normal, pleasant  Breast/gyn/rectal - deferred to gynecology     Assessment and Plan :   Encounter  Diagnoses  Name Primary?   Encounter for health maintenance examination in adult Yes   Vitamin D deficiency    Vaccine counseling    S/P myomectomy    Liver hemangioma    Liver cyst    Iron deficiency    Diverticulosis    Decreased rectal sphincter tone    Chronic constipation    Chronic abdominal pain    Bruising    Anemia, unspecified type    Allergic rhinitis, unspecified seasonality, unspecified trigger    Screen for STD (sexually transmitted disease)    Encounter for hepatitis C screening test for low risk patient    Tobacco abuse    Vaccine refused by patient      This visit was a preventative care visit, also known as wellness visit or routine physical.   Topics typically include healthy lifestyle, diet, exercise, preventative care, vaccinations, sick and well care, proper use of emergency dept and after hours care, as well as other concerns.     Recommendations: Continue to return yearly for your annual wellness and preventative care visits.  This gives [place] a chance to discuss healthy lifestyle, exercise, vaccinations, review your chart record, and perform screenings where appropriate.  I recommend you see your eye doctor yearly for routine vision  care.  I recommend you see your dentist yearly for routine dental care including hygiene visits twice yearly.  See your gynecologist yearly for routine gynecological care.    Vaccination recommendations were reviewed Immunization History  Administered Date(s) Administered   Influenza,inj,Quad PF,6+ Mos 09/27/2018, 09/12/2019    She declines vaccines today  She notes tetanus up to date with gynecology in last 10 years  She gets flu shot today with her mother at the pharmacy   Screening for cancer: Colon cancer screening: Pending possible colonoscopy with GI after first of the year  Breast cancer screening: You should perform a self breast exam monthly.   We reviewed recommendations for regular mammograms and  breast cancer screening.  Cervical cancer screening: We reviewed recommendations for pap smear screening.   Skin cancer screening: Check your skin regularly for new changes, growing lesions, or other lesions of concern Come in for evaluation if you have skin lesions of concern.  Lung cancer screening: If you have a greater than 20 pack year history of tobacco use, then you may qualify for lung cancer screening with a chest CT scan.   Please call your insurance company to inquire about coverage for this test.  We currently don't have screenings for other cancers besides breast, cervical, colon, and lung cancers.  If you have a strong family history of cancer or have other cancer screening concerns, please let me know.    Bone health: Get at least 150 minutes of aerobic exercise weekly Get weight bearing exercise at least once weekly Bone density test:  A bone density test is an imaging test that uses a type of X-ray to measure the amount of calcium and other minerals in your bones. The test may be used to diagnose or screen you for a condition that causes weak or thin bones (osteoporosis), predict your risk for a broken bone (fracture), or determine how well your osteoporosis treatment is working. The bone density test is recommended for females 97 and older, or females or males <77 if certain risk factors such as thyroid disease, long term use of steroids such as for asthma or rheumatological issues, vitamin D deficiency, estrogen deficiency, family history of osteoporosis, self or family history of fragility fracture in first degree relative.    Heart health: Get at least 150 minutes of aerobic exercise weekly Limit alcohol It is important to maintain a healthy blood pressure and healthy cholesterol numbers  Heart disease screening: Screening for heart disease includes screening for blood pressure, fasting lipids, glucose/diabetes screening, BMI height to weight ratio, reviewed of  smoking status, physical activity, and diet.    Goals include blood pressure 120/80 or less, maintaining a healthy lipid/cholesterol profile, preventing diabetes or keeping diabetes numbers under good control, not smoking or using tobacco products, exercising most days per week or at least 150 minutes per week of exercise, and eating healthy variety of fruits and vegetables, healthy oils, and avoiding unhealthy food choices like fried food, fast food, high sugar and high cholesterol foods.    Other tests may possibly include EKG test, CT coronary calcium score, echocardiogram, exercise treadmill stress test.    Medical care options: I recommend you continue to seek care here first for routine care.  We try really hard to have available appointments Monday through Friday daytime hours for sick visits, acute visits, and physicals.  Urgent care should be used for after hours and weekends for significant issues that cannot wait till the next day.  The emergency  department should be used for significant potentially life-threatening emergencies.  The emergency department is expensive, can often have long wait times for less significant concerns, so try to utilize primary care, urgent care, or telemedicine when possible to avoid unnecessary trips to the emergency department.  Virtual visits and telemedicine have been introduced since the pandemic started in 2020, and can be convenient ways to receive medical care.  We offer virtual appointments as well to assist you in a variety of options to seek medical care.    Separate significant issues discussed: Vitamin D deficiency-updated labs today  I reviewed recent gastroenterology note under consult.  She is seeing no abdominal pain, constipation, history of liver cyst  Strongly recommended she quit smoking.  Advised 100 quit NOW hotline.  She declines medication to help her quit smoking.  Bruising-labs today for further evaluation  Susan Drake was seen today for  fasting cpe .  Diagnoses and all orders for this visit:  Encounter for health maintenance examination in adult -     Comprehensive metabolic panel -     CBC with Differential/Platelet -     Sedimentation rate -     Lipid panel -     VITAMIN D 25 Hydroxy (Vit-D Deficiency, Fractures) -     Urinalysis, Routine w reflex microscopic -     HIV Antibody (routine testing w rflx) -     RPR -     Hepatitis C antibody -     Iron, TIBC and Ferritin Panel  Vitamin D deficiency -     VITAMIN D 25 Hydroxy (Vit-D Deficiency, Fractures)  Vaccine counseling  S/P myomectomy  Liver hemangioma  Liver cyst  Iron deficiency -     Iron, TIBC and Ferritin Panel  Diverticulosis  Decreased rectal sphincter tone  Chronic constipation  Chronic abdominal pain  Bruising -     CBC with Differential/Platelet  Anemia, unspecified type -     CBC with Differential/Platelet -     Iron, TIBC and Ferritin Panel  Allergic rhinitis, unspecified seasonality, unspecified trigger  Screen for STD (sexually transmitted disease) -     HIV Antibody (routine testing w rflx) -     RPR -     Hepatitis C antibody  Encounter for hepatitis C screening test for low risk patient -     Hepatitis C antibody  Tobacco abuse  Vaccine refused by patient     Follow-up pending labs, yearly for physical

## 2021-08-22 LAB — CBC WITH DIFFERENTIAL/PLATELET
Basophils Absolute: 0 10*3/uL (ref 0.0–0.2)
Basos: 0 %
EOS (ABSOLUTE): 0.1 10*3/uL (ref 0.0–0.4)
Eos: 1 %
Hematocrit: 37.7 % (ref 34.0–46.6)
Hemoglobin: 12 g/dL (ref 11.1–15.9)
Immature Grans (Abs): 0 10*3/uL (ref 0.0–0.1)
Immature Granulocytes: 0 %
Lymphocytes Absolute: 1.6 10*3/uL (ref 0.7–3.1)
Lymphs: 36 %
MCH: 26.1 pg — ABNORMAL LOW (ref 26.6–33.0)
MCHC: 31.8 g/dL (ref 31.5–35.7)
MCV: 82 fL (ref 79–97)
Monocytes Absolute: 0.4 10*3/uL (ref 0.1–0.9)
Monocytes: 8 %
Neutrophils Absolute: 2.5 10*3/uL (ref 1.4–7.0)
Neutrophils: 55 %
Platelets: 136 10*3/uL — ABNORMAL LOW (ref 150–450)
RBC: 4.59 x10E6/uL (ref 3.77–5.28)
RDW: 13 % (ref 11.7–15.4)
WBC: 4.5 10*3/uL (ref 3.4–10.8)

## 2021-08-22 LAB — RPR: RPR Ser Ql: NONREACTIVE

## 2021-08-22 LAB — LIPID PANEL
Chol/HDL Ratio: 2.7 ratio (ref 0.0–4.4)
Cholesterol, Total: 145 mg/dL (ref 100–199)
HDL: 54 mg/dL (ref >39)
LDL Chol Calc (NIH): 82 mg/dL (ref 0–99)
Triglycerides: 40 mg/dL (ref 0–149)
VLDL Cholesterol Cal: 9 mg/dL (ref 5–40)

## 2021-08-22 LAB — COMPREHENSIVE METABOLIC PANEL
ALT: 10 IU/L (ref 0–32)
AST: 14 IU/L (ref 0–40)
Albumin/Globulin Ratio: 2.2 (ref 1.2–2.2)
Albumin: 4.3 g/dL (ref 3.8–4.8)
Alkaline Phosphatase: 36 IU/L — ABNORMAL LOW (ref 44–121)
BUN/Creatinine Ratio: 15 (ref 9–23)
BUN: 11 mg/dL (ref 6–24)
Bilirubin Total: 0.4 mg/dL (ref 0.0–1.2)
CO2: 24 mmol/L (ref 20–29)
Calcium: 8.5 mg/dL — ABNORMAL LOW (ref 8.7–10.2)
Chloride: 107 mmol/L — ABNORMAL HIGH (ref 96–106)
Creatinine, Ser: 0.74 mg/dL (ref 0.57–1.00)
Globulin, Total: 2 g/dL (ref 1.5–4.5)
Glucose: 80 mg/dL (ref 70–99)
Potassium: 4.4 mmol/L (ref 3.5–5.2)
Sodium: 141 mmol/L (ref 134–144)
Total Protein: 6.3 g/dL (ref 6.0–8.5)
eGFR: 104 mL/min/{1.73_m2} (ref >59)

## 2021-08-22 LAB — HIV ANTIBODY (ROUTINE TESTING W REFLEX): HIV Screen 4th Generation wRfx: NONREACTIVE

## 2021-08-22 LAB — URINALYSIS, ROUTINE W REFLEX MICROSCOPIC
Bilirubin, UA: NEGATIVE
Glucose, UA: NEGATIVE
Ketones, UA: NEGATIVE
Leukocytes,UA: NEGATIVE
Nitrite, UA: NEGATIVE
Protein,UA: NEGATIVE
Specific Gravity, UA: 1.007 (ref 1.005–1.030)
Urobilinogen, Ur: 0.2 mg/dL (ref 0.2–1.0)
pH, UA: 7 (ref 5.0–7.5)

## 2021-08-22 LAB — MICROSCOPIC EXAMINATION
Bacteria, UA: NONE SEEN
Casts: NONE SEEN /lpf
WBC, UA: NONE SEEN /hpf (ref 0–5)

## 2021-08-22 LAB — IRON,TIBC AND FERRITIN PANEL
Ferritin: 19 ng/mL (ref 15–150)
Iron Saturation: 25 % (ref 15–55)
Iron: 68 ug/dL (ref 27–159)
Total Iron Binding Capacity: 276 ug/dL (ref 250–450)
UIBC: 208 ug/dL (ref 131–425)

## 2021-08-22 LAB — VITAMIN D 25 HYDROXY (VIT D DEFICIENCY, FRACTURES): Vit D, 25-Hydroxy: 24.5 ng/mL — ABNORMAL LOW (ref 30.0–100.0)

## 2021-08-22 LAB — HEPATITIS C ANTIBODY: Hep C Virus Ab: <0.1 s/co ratio (ref 0.0–0.9)

## 2021-08-22 LAB — SEDIMENTATION RATE: Sed Rate: 7 mm/hr (ref 0–32)

## 2021-08-24 ENCOUNTER — Other Ambulatory Visit: Payer: Self-pay | Admitting: Medical

## 2021-08-24 DIAGNOSIS — D696 Thrombocytopenia, unspecified: Secondary | ICD-10-CM

## 2021-08-24 MED ORDER — VITAMIN D 50 MCG (2000 UT) PO CAPS: 1.0000 | ORAL_CAPSULE | Freq: Every day | ORAL | 3 refills | Status: DC

## 2021-08-26 ENCOUNTER — Other Ambulatory Visit: Payer: Self-pay | Admitting: Medical

## 2021-08-26 DIAGNOSIS — R3129 Other microscopic hematuria: Secondary | ICD-10-CM

## 2021-08-28 ENCOUNTER — Other Ambulatory Visit: Payer: Self-pay

## 2021-08-28 ENCOUNTER — Other Ambulatory Visit: Payer: 59

## 2021-08-28 DIAGNOSIS — R3129 Other microscopic hematuria: Secondary | ICD-10-CM

## 2021-08-28 LAB — URINALYSIS, ROUTINE W REFLEX MICROSCOPIC
Bilirubin, UA: NEGATIVE
Glucose, UA: NEGATIVE
Ketones, UA: NEGATIVE
Leukocytes,UA: NEGATIVE
Nitrite, UA: NEGATIVE
Protein,UA: NEGATIVE
RBC, UA: NEGATIVE
Specific Gravity, UA: 1.011 (ref 1.005–1.030)
Urobilinogen, Ur: 0.2 mg/dL (ref 0.2–1.0)
pH, UA: 6 (ref 5.0–7.5)

## 2021-09-11 ENCOUNTER — Other Ambulatory Visit: Payer: 59

## 2021-09-11 ENCOUNTER — Other Ambulatory Visit: Payer: Self-pay

## 2021-09-11 ENCOUNTER — Telehealth: Payer: Self-pay | Admitting: Medical

## 2021-09-11 NOTE — Telephone Encounter (Signed)
Urine repeat today thankfully shows no blood or abnormality

## 2021-09-14 NOTE — Telephone Encounter (Signed)
Pt was notified.  

## 2021-09-14 NOTE — Telephone Encounter (Signed)
Left message for pt to call me back 

## 2021-09-16 ENCOUNTER — Other Ambulatory Visit: Payer: Self-pay

## 2021-09-16 ENCOUNTER — Inpatient Hospital Stay: Payer: 59 | Attending: Oncology | Admitting: Oncology

## 2021-09-16 ENCOUNTER — Inpatient Hospital Stay: Payer: 59

## 2021-09-16 ENCOUNTER — Encounter: Payer: Self-pay | Admitting: Oncology

## 2021-09-16 VITALS — BP 130/88 | Temp 81.0°F | Resp 18 | Ht 63.0 in | Wt 159.6 lb

## 2021-09-16 DIAGNOSIS — D693 Immune thrombocytopenic purpura: Secondary | ICD-10-CM | POA: Insufficient documentation

## 2021-09-16 DIAGNOSIS — Z8051 Family history of malignant neoplasm of kidney: Secondary | ICD-10-CM | POA: Diagnosis not present

## 2021-09-16 DIAGNOSIS — D696 Thrombocytopenia, unspecified: Secondary | ICD-10-CM | POA: Insufficient documentation

## 2021-09-16 DIAGNOSIS — Z807 Family history of other malignant neoplasms of lymphoid, hematopoietic and related tissues: Secondary | ICD-10-CM | POA: Diagnosis not present

## 2021-09-16 DIAGNOSIS — Z803 Family history of malignant neoplasm of breast: Secondary | ICD-10-CM | POA: Diagnosis not present

## 2021-09-16 LAB — CBC WITH DIFFERENTIAL/PLATELET
Abs Immature Granulocytes: 0.01 10*3/uL (ref 0.00–0.07)
Basophils Absolute: 0 10*3/uL (ref 0.0–0.1)
Basophils Relative: 0 %
Eosinophils Absolute: 0 10*3/uL (ref 0.0–0.5)
Eosinophils Relative: 0 %
HCT: 42.1 % (ref 36.0–46.0)
Hemoglobin: 13.2 g/dL (ref 12.0–15.0)
Immature Granulocytes: 0 %
Lymphocytes Relative: 26 %
Lymphs Abs: 1.8 10*3/uL (ref 0.7–4.0)
MCH: 25.8 pg — ABNORMAL LOW (ref 26.0–34.0)
MCHC: 31.4 g/dL (ref 30.0–36.0)
MCV: 82.4 fL (ref 80.0–100.0)
Monocytes Absolute: 0.5 10*3/uL (ref 0.1–1.0)
Monocytes Relative: 8 %
Neutro Abs: 4.3 10*3/uL (ref 1.7–7.7)
Neutrophils Relative %: 66 %
Platelets: 138 10*3/uL — ABNORMAL LOW (ref 150–400)
RBC: 5.11 MIL/uL (ref 3.87–5.11)
RDW: 13.2 % (ref 11.5–15.5)
Smear Review: NORMAL
WBC: 6.7 10*3/uL (ref 4.0–10.5)
nRBC: 0 % (ref 0.0–0.2)

## 2021-09-16 LAB — COMPREHENSIVE METABOLIC PANEL
ALT: 15 U/L (ref 0–44)
AST: 18 U/L (ref 15–41)
Albumin: 4.4 g/dL (ref 3.5–5.0)
Alkaline Phosphatase: 33 U/L — ABNORMAL LOW (ref 38–126)
Anion gap: 9 (ref 5–15)
BUN: 12 mg/dL (ref 6–20)
CO2: 24 mmol/L (ref 22–32)
Calcium: 8.8 mg/dL — ABNORMAL LOW (ref 8.9–10.3)
Chloride: 102 mmol/L (ref 98–111)
Creatinine, Ser: 0.79 mg/dL (ref 0.44–1.00)
GFR, Estimated: >60 mL/min (ref >60)
Glucose, Bld: 82 mg/dL (ref 70–99)
Potassium: 4.3 mmol/L (ref 3.5–5.1)
Sodium: 135 mmol/L (ref 135–145)
Total Bilirubin: 0.5 mg/dL (ref 0.3–1.2)
Total Protein: 7.4 g/dL (ref 6.5–8.1)

## 2021-09-16 LAB — FOLATE: Folate: 14.1 ng/mL (ref >5.9)

## 2021-09-16 LAB — HEPATITIS B CORE ANTIBODY, IGM: Hep B C IgM: NONREACTIVE

## 2021-09-16 LAB — VITAMIN B12: Vitamin B-12: 341 pg/mL (ref 180–914)

## 2021-09-16 LAB — IMMATURE PLATELET FRACTION: Immature Platelet Fraction: 18.7 % — ABNORMAL HIGH (ref 1.2–8.6)

## 2021-09-16 LAB — LACTATE DEHYDROGENASE: LDH: 124 U/L (ref 98–192)

## 2021-09-16 LAB — HEPATITIS B SURFACE ANTIGEN: Hepatitis B Surface Ag: NONREACTIVE

## 2021-09-16 NOTE — Progress Notes (Signed)
Hematology/Oncology Consult note [facility] [phone] [phone]   Patient Care Team: Tysinger, Leward Quan as PCP - General (Family Medicine)  REFERRING PROVIDER: Carlena Hurl, PA-C  CHIEF COMPLAINTS/REASON FOR VISIT:  Evaluation of thrombocytopenia  HISTORY OF PRESENTING ILLNESS:  Susan Drake is a 40 y.o. female who was seen in consultation at the request of Tysinger, Camelia Eng, PA-C for evaluation of thrombocytopenia   Reviewed patient's labs done previously.  08/21/2021 labs showed decreased platelet counts at 136,000. Normal wbc normal hemoglobin  Reviewed patient's previous labs. Thrombocytopenia is intermittent, chronic onset , since at least 2013 No aggravating or elevated factors.  Associated symptoms or signs:  Denies weight loss, fever, chills, fatigue, night sweats.  Denies hematochezia, hematuria, hematemesis, epistaxis, black tarry stool. + easy bruising on her bilateral lower extremities..   Patient drinks alcohol very occasionally. Patient denies any bleeding complications of the previous myomectomy.  Patient has had wisdom teeth extraction and she recalls that she had some prolonged bleeding after the procedure.   Review of Systems  Constitutional:  Negative for appetite change, chills, fatigue and fever.  HENT:   Negative for hearing loss and voice change.   Eyes:  Negative for eye problems.  Respiratory:  Negative for chest tightness and cough.   Cardiovascular:  Negative for chest pain.  Gastrointestinal:  Negative for abdominal distention, abdominal pain and blood in stool.  Endocrine: Negative for hot flashes.  Genitourinary:  Negative for difficulty urinating and frequency.   Musculoskeletal:  Negative for arthralgias.  Skin:  Negative for itching and rash.  Neurological:  Negative for extremity weakness.  Hematological:  Negative for adenopathy. Bruises/bleeds easily.  Psychiatric/Behavioral:   Negative for confusion.    MEDICAL HISTORY:  Past Medical History:  Diagnosis Date   Anxiety    Constipation    sees Dr. Juanita Craver   Dermatological disease    sees Dr. Allyson Sabal, bilat upper arms and chest   Diverticulosis    Mild case   History of chlamydia    hx/o trichomonas and chlamydia in remote past   Liver cyst    Seasonal allergic rhinitis    Uterine fibroid     SURGICAL HISTORY: Past Surgical History:  Procedure Laterality Date   BREAST BIOPSY Left    COLONOSCOPY     no prior colonoscopy   MYOMECTOMY  05/03/2012   Procedure: MYOMECTOMY;  Surgeon: Marvene Staff, MD;  Location: [facility];  Service: Gynecology;  Laterality: N/A;   Exploratory Laparotomy myomectomy. Open at 1410.   MYOMECTOMY  06/21/2019   Exploratory Laparotomy MYOMECTOMY (N/A Abdomen)   MYOMECTOMY N/A 06/21/2019   Procedure: Exploratory Laparotomy MYOMECTOMY;  Surgeon: Servando Salina, MD;  Location: [facility];  Service: Gynecology;  Laterality: N/A;   ROBOT ASSISTED MYOMECTOMY  05/03/2012   Procedure: ROBOTIC ASSISTED MYOMECTOMY;  Surgeon: Marvene Staff, MD;  Location: [facility];  Service: Gynecology;  Laterality: N/A;  Times two fibroids with robot.   WISDOM TOOTH EXTRACTION  2011    SOCIAL HISTORY: Social History   Socioeconomic History   Marital status: Married    Spouse name: Not on file   Number of children: Not on file   Years of education: Not on file   Highest education level: Not on file  Occupational History   Not on file  Tobacco Use   Smoking status: Light Smoker    Types: Cigars   Smokeless tobacco: Never   Tobacco comments:    cigars  occaionally  Vaping Use   Vaping Use: Never used  Substance and Sexual Activity   Alcohol use: Yes    Comment: socially   Drug use: No   Sexual activity: Not on file  Other Topics Concern   Not on file  Social History Narrative   Married, no children, exercise - some with treadmill, cardio, goes to gym, works at [employer]   08/2021   Social Determinants of Health   Financial Resource Strain: Not on file  Food Insecurity: Not on file  Transportation Needs: Not on file  Physical Activity: Not on file  Stress: Not on file  Social Connections: Not on file  Intimate Partner Violence: Not on file    FAMILY HISTORY: Family History  Problem Relation Age of Onset   COPD Mother    Cancer Mother        non hodgkins lymphoma   Appendicitis Mother    Non-Hodgkin's lymphoma Mother    Aortic aneurysm Mother    COPD Father        emphysema   Hypertension Father    Eczema Father    Kidney disease Sister    Kidney cancer Sister    Cerebral palsy Sister    Fibroids Sister    Arthritis Brother    Cancer Maternal Uncle        liver   Breast cancer Paternal Grandmother    Hypertension Cousin    Heart disease Neg Hx    Stroke Neg Hx    Diabetes Neg Hx     ALLERGIES:  has No Known Allergies.  MEDICATIONS:  Current Outpatient Medications  Medication Sig Dispense Refill   Cholecalciferol (VITAMIN D) 50 MCG (2000 UT) CAPS Take 1 capsule (2,000 Units total) by mouth daily. 90 capsule 3   fluticasone (FLONASE) 50 MCG/ACT nasal spray SPRAY 2 SPRAYS INTO EACH NOSTRIL EVERY DAY 16 mL 3   ibuprofen (ADVIL) 800 MG tablet Take 800 mg by mouth every 8 (eight) hours as needed.     Multiple Vitamins-Minerals (ONE-A-DAY WOMENS PO) Take 1 tablet by mouth every other day.     Probiotic Product (PROBIOTIC DAILY PO) Take 1 capsule by mouth daily.      No current facility-administered medications for this visit.     PHYSICAL EXAMINATION: ECOG PERFORMANCE STATUS: 0 - Asymptomatic Vitals:   09/16/21 1138  BP: 130/88  Resp: 18  Temp: (!) 81 F (27.2 C)   Filed Weights   09/16/21 1138  Weight: 159 lb 9.6 oz (72.4 kg)    Physical Exam Constitutional:      General: She is not in acute distress. HENT:     Head: Normocephalic and atraumatic.  Eyes:     General: No scleral icterus. Cardiovascular:     Rate and  Rhythm: Normal rate and regular rhythm.     Heart sounds: Normal heart sounds.  Pulmonary:     Effort: Pulmonary effort is normal. No respiratory distress.     Breath sounds: No wheezing.  Abdominal:     General: Bowel sounds are normal. There is no distension.     Palpations: Abdomen is soft.  Musculoskeletal:        General: No deformity. Normal range of motion.     Cervical back: Normal range of motion and neck supple.  Skin:    General: Skin is warm and dry.     Findings: No erythema or rash.  Neurological:     Mental Status: She is alert and oriented  to person, place, and time. Mental status is at baseline.     Cranial Nerves: No cranial nerve deficit.     Coordination: Coordination normal.  Psychiatric:        Mood and Affect: Mood normal.     LABORATORY DATA:  I have reviewed the data as listed Lab Results  Component Value Date   WBC 6.7 09/16/2021   HGB 13.2 09/16/2021   HCT 42.1 09/16/2021   MCV 82.4 09/16/2021   PLT 138 (L) 09/16/2021   Recent Labs    08/21/21 1106 09/16/21 1202  NA 141 135  K 4.4 4.3  CL 107* 102  CO2 24 24  GLUCOSE 80 82  BUN 11 12  CREATININE 0.74 0.79  CALCIUM 8.5* 8.8*  GFRNONAA  --  >60  PROT 6.3 7.4  ALBUMIN 4.3 4.4  AST 14 18  ALT 10 15  ALKPHOS 36* 33*  BILITOT 0.4 0.5   Iron/TIBC/Ferritin/ %Sat    Component Value Date/Time   IRON 68 08/21/2021 1106   TIBC 276 08/21/2021 1106   FERRITIN 19 08/21/2021 1106   IRONPCTSAT 25 08/21/2021 1106    08/21/2021 HIV negative, hepatitis C negative   RADIOGRAPHIC STUDIES: I have personally reviewed the radiological images as listed and agreed with the findings in the report.  No results found.   ASSESSMENT & PLAN:  1. Thrombocytopenia ([place])     For the work up of patient's thrombocytopenia, I recommend checking CBC;CMP, LDH; smear review, folate, Vitamin B12, hepatitis B,  ANA,  flowcytometry and monoclonal gammopathy workup.  Discussed with patient that her current level  of thrombocytopenia does not cause significant bleeding risk.  # Patient follow-up with me in approximately 2-3 weeks to review the above results.   Orders Placed This Encounter  Procedures   Folate    Standing Status:   Future    Number of Occurrences:   1    Standing Expiration Date:   09/16/2022   Vitamin B12    Standing Status:   Future    Number of Occurrences:   1    Standing Expiration Date:   09/16/2022   Comprehensive metabolic panel    Standing Status:   Future    Number of Occurrences:   1    Standing Expiration Date:   09/16/2022   CBC with Differential/Platelet    Standing Status:   Future    Number of Occurrences:   1    Standing Expiration Date:   09/16/2022   Lactate dehydrogenase    Standing Status:   Future    Number of Occurrences:   1    Standing Expiration Date:   09/16/2022   Flow cytometry panel-leukemia/lymphoma work-up    Standing Status:   Future    Number of Occurrences:   1    Standing Expiration Date:   09/16/2022   Immature Platelet Fraction    Standing Status:   Future    Number of Occurrences:   1    Standing Expiration Date:   09/16/2022   ANA, IFA (with reflex)    Standing Status:   Future    Number of Occurrences:   1    Standing Expiration Date:   09/16/2022   Hepatitis B surface antigen    Standing Status:   Future    Number of Occurrences:   1    Standing Expiration Date:   09/16/2022   Hepatitis B core antibody, IgM    Standing Status:  Future    Number of Occurrences:   1    Standing Expiration Date:   09/16/2022    All questions were answered. The patient knows to call the clinic with any problems questions or concerns.  Cc Tysinger, Camelia Eng, PA-C  Return of visit: 2 weeks.  Thank you for this kind referral and the opportunity to participate in the care of this patient. A copy of today's note is routed to referring provider    Earlie Server, MD, PhD 09/16/2021

## 2021-09-16 NOTE — Progress Notes (Signed)
Patient here to establish care  

## 2021-09-18 LAB — COMP PANEL: LEUKEMIA/LYMPHOMA

## 2021-09-21 LAB — ANTINUCLEAR ANTIBODIES, IFA: ANA Ab, IFA: NEGATIVE

## 2021-09-29 ENCOUNTER — Encounter: Payer: Self-pay | Admitting: Oncology

## 2021-10-07 ENCOUNTER — Inpatient Hospital Stay: Payer: 59 | Attending: Oncology | Admitting: Oncology

## 2021-10-07 ENCOUNTER — Other Ambulatory Visit: Payer: Self-pay

## 2021-10-07 ENCOUNTER — Encounter: Payer: Self-pay | Admitting: Oncology

## 2021-10-07 VITALS — BP 131/79 | HR 99 | Temp 97.7°F | Resp 18

## 2021-10-07 DIAGNOSIS — D696 Thrombocytopenia, unspecified: Secondary | ICD-10-CM | POA: Insufficient documentation

## 2021-10-07 DIAGNOSIS — Z8051 Family history of malignant neoplasm of kidney: Secondary | ICD-10-CM | POA: Insufficient documentation

## 2021-10-07 NOTE — Addendum Note (Signed)
Addended by: Evelina Dun on: 10/07/2021 01:32 PM   Modules accepted: Orders

## 2021-10-07 NOTE — Progress Notes (Signed)
Pt here for follow up. No new concerns voiced.   

## 2021-10-07 NOTE — Progress Notes (Signed)
Hematology/Oncology Progress note [phone] [phone]   Patient Care Team: Tysinger, Camelia Eng, PA-C as PCP - General (Family Medicine)  REFERRING PROVIDER: Glade Lloyd Camelia Eng, PA-C  CHIEF COMPLAINTS/REASON FOR VISIT:  Evaluation of thrombocytopenia  HISTORY OF PRESENTING ILLNESS:  Susan Drake is a 40 y.o. female who was seen in consultation at the request of Tysinger, Camelia Eng, PA-C for evaluation of thrombocytopenia   Reviewed patient's labs done previously.  08/21/2021 labs showed decreased platelet counts at 136,000. Normal wbc normal hemoglobin  Reviewed patient's previous labs. Thrombocytopenia is intermittent, chronic onset , since at least 2013 No aggravating or elevated factors.  Associated symptoms or signs:  Denies weight loss, fever, chills, fatigue, night sweats.  Denies hematochezia, hematuria, hematemesis, epistaxis, black tarry stool. + easy bruising on her bilateral lower extremities..   Patient drinks alcohol very occasionally. Patient denies any bleeding complications of the previous myomectomy.  Patient has had wisdom teeth extraction and she recalls that she had some prolonged bleeding after the procedure.  INTERVAL HISTORY Susan Drake is a 40 y.o. female who has above history reviewed by me today presents for follow up visit for thrombocytopenia,  Patient had blood work done and presents to discuss results.   Review of Systems  Constitutional:  Negative for appetite change, chills, fatigue and fever.  HENT:   Negative for hearing loss and voice change.   Eyes:  Negative for eye problems.  Respiratory:  Negative for chest tightness and cough.   Cardiovascular:  Negative for chest pain.  Gastrointestinal:  Negative for abdominal distention, abdominal pain and blood in stool.  Endocrine: Negative for hot flashes.  Genitourinary:  Negative for difficulty urinating and frequency.   Musculoskeletal:  Negative for arthralgias.  Skin:   Negative for itching and rash.  Neurological:  Negative for extremity weakness.  Hematological:  Negative for adenopathy. Bruises/bleeds easily.  Psychiatric/Behavioral:  Negative for confusion.    MEDICAL HISTORY:  Past Medical History:  Diagnosis Date   Anxiety    Constipation    sees Dr. Juanita Craver   Dermatological disease    sees Dr. Allyson Sabal, bilat upper arms and chest   Diverticulosis    Mild case   History of chlamydia    hx/o trichomonas and chlamydia in remote past   Liver cyst    Seasonal allergic rhinitis    Uterine fibroid     SURGICAL HISTORY: Past Surgical History:  Procedure Laterality Date   BREAST BIOPSY Left    COLONOSCOPY     no prior colonoscopy   MYOMECTOMY  05/03/2012   Procedure: MYOMECTOMY;  Surgeon: Marvene Staff, MD;  Location: [facility];  Service: Gynecology;  Laterality: N/A;   Exploratory Laparotomy myomectomy. Open at 1410.   MYOMECTOMY  06/21/2019   Exploratory Laparotomy MYOMECTOMY (N/A Abdomen)   MYOMECTOMY N/A 06/21/2019   Procedure: Exploratory Laparotomy MYOMECTOMY;  Surgeon: Servando Salina, MD;  Location: [facility];  Service: Gynecology;  Laterality: N/A;   ROBOT ASSISTED MYOMECTOMY  05/03/2012   Procedure: ROBOTIC ASSISTED MYOMECTOMY;  Surgeon: Marvene Staff, MD;  Location: [facility];  Service: Gynecology;  Laterality: N/A;  Times two fibroids with robot.   WISDOM TOOTH EXTRACTION  2011    SOCIAL HISTORY: Social History   Socioeconomic History   Marital status: Married    Spouse name: Not on file   Number of children: Not on file   Years of education: Not on file   Highest education level: Not on file  Occupational  History   Not on file  Tobacco Use   Smoking status: Light Smoker    Types: Cigars   Smokeless tobacco: Never   Tobacco comments:    cigars occaionally  Vaping Use   Vaping Use: Never used  Substance and Sexual Activity   Alcohol use: Yes    Comment: socially   Drug use: No   Sexual activity: Not on file   Other Topics Concern   Not on file  Social History Narrative   Married, no children, exercise - some with treadmill, cardio, goes to gym, works at [employer]  08/2021   Social Determinants of Health   Financial Resource Strain: Not on file  Food Insecurity: Not on file  Transportation Needs: Not on file  Physical Activity: Not on file  Stress: Not on file  Social Connections: Not on file  Intimate Partner Violence: Not on file    FAMILY HISTORY: Family History  Problem Relation Age of Onset   COPD Mother    Cancer Mother        non hodgkins lymphoma   Appendicitis Mother    Non-Hodgkin's lymphoma Mother    Aortic aneurysm Mother    COPD Father        emphysema   Hypertension Father    Eczema Father    Kidney disease Sister    Kidney cancer Sister    Cerebral palsy Sister    Fibroids Sister    Arthritis Brother    Cancer Maternal Uncle        liver   Breast cancer Paternal Grandmother    Hypertension Cousin    Heart disease Neg Hx    Stroke Neg Hx    Diabetes Neg Hx     ALLERGIES:  has No Known Allergies.  MEDICATIONS:  Current Outpatient Medications  Medication Sig Dispense Refill   Cholecalciferol (VITAMIN D) 50 MCG (2000 UT) CAPS Take 1 capsule (2,000 Units total) by mouth daily. 90 capsule 3   fluticasone (FLONASE) 50 MCG/ACT nasal spray SPRAY 2 SPRAYS INTO EACH NOSTRIL EVERY DAY 16 mL 3   ibuprofen (ADVIL) 800 MG tablet Take 800 mg by mouth every 8 (eight) hours as needed.     Multiple Vitamins-Minerals (ONE-A-DAY WOMENS PO) Take 1 tablet by mouth every other day.     Probiotic Product (PROBIOTIC DAILY PO) Take 1 capsule by mouth daily.      No current facility-administered medications for this visit.     PHYSICAL EXAMINATION: ECOG PERFORMANCE STATUS: 0 - Asymptomatic Vitals:   10/07/21 1057  BP: 131/79  Pulse: 99  Resp: 18  Temp: 97.7 F (36.5 C)   There were no vitals filed for this visit.   Physical Exam Constitutional:       General: She is not in acute distress. HENT:     Head: Normocephalic and atraumatic.  Eyes:     General: No scleral icterus. Cardiovascular:     Rate and Rhythm: Normal rate.  Pulmonary:     Effort: Pulmonary effort is normal. No respiratory distress.  Abdominal:     General: There is no distension.  Musculoskeletal:        General: No deformity. Normal range of motion.     Cervical back: Normal range of motion and neck supple.  Skin:    Findings: No rash.  Neurological:     Mental Status: She is alert and oriented to person, place, and time. Mental status is at baseline.     Cranial Nerves:  No cranial nerve deficit.  Psychiatric:        Mood and Affect: Mood normal.     LABORATORY DATA:  I have reviewed the data as listed Lab Results  Component Value Date   WBC 6.7 09/16/2021   HGB 13.2 09/16/2021   HCT 42.1 09/16/2021   MCV 82.4 09/16/2021   PLT 138 (L) 09/16/2021   Recent Labs    08/21/21 1106 09/16/21 1202  NA 141 135  K 4.4 4.3  CL 107* 102  CO2 24 24  GLUCOSE 80 82  BUN 11 12  CREATININE 0.74 0.79  CALCIUM 8.5* 8.8*  GFRNONAA  --  >60  PROT 6.3 7.4  ALBUMIN 4.3 4.4  AST 14 18  ALT 10 15  ALKPHOS 36* 33*  BILITOT 0.4 0.5    Iron/TIBC/Ferritin/ %Sat    Component Value Date/Time   IRON 68 08/21/2021 1106   TIBC 276 08/21/2021 1106   FERRITIN 19 08/21/2021 1106   IRONPCTSAT 25 08/21/2021 1106     08/21/2021 HIV negative, hepatitis C negative   RADIOGRAPHIC STUDIES: I have personally reviewed the radiological images as listed and agreed with the findings in the report.  No results found.   ASSESSMENT & PLAN:  1. Thrombocytopenia (HCC)    #Thrombocytopenia, mild. I discussed with patient that he does not have significant bleeding risk given that her platelet count is > 100,000 Immature platelet fraction was elevated, indicating peripheral destruction.  Check left upper quadrant ultrasound.  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  Observation.  Vitamin B12 level is normal, on the low normal end.  Recommend patient to take empiric vitamin B12 1000 MCG daily.  Avoid alcohol   # Patient follow-up with me in 6 months  Orders Placed This Encounter  Procedures   Comprehensive metabolic panel    Standing Status:   Future    Standing Expiration Date:   10/07/2022   CBC with Differential/Platelet    Standing Status:   Future    Standing Expiration Date:   10/07/2022   Immature Platelet Fraction    Standing Status:   Future    Standing Expiration Date:   10/07/2022   Lactate dehydrogenase    Standing Status:   Future    Standing Expiration Date:   10/07/2022   Vitamin B12    Standing Status:   Future    Standing Expiration Date:   10/07/2022   Technologist smear review    Standing Status:   Future    Standing Expiration Date:   10/07/2022    All questions were answered. The patient knows to call the clinic with any problems questions or concerns.  Cc Tysinger, Camelia Eng, PA-C    Earlie Server, MD, PhD 10/07/2021

## 2021-10-13 ENCOUNTER — Other Ambulatory Visit: Payer: Self-pay

## 2021-10-13 ENCOUNTER — Ambulatory Visit
Admission: RE | Admit: 2021-10-13 | Discharge: 2021-10-13 | Disposition: A | Discharge status: Home / Self Care | Payer: 59 | Source: Ambulatory Visit | Attending: Oncology | Admitting: Oncology

## 2021-10-13 DIAGNOSIS — D696 Thrombocytopenia, unspecified: Secondary | ICD-10-CM

## 2021-11-17 ENCOUNTER — Encounter: Payer: Self-pay | Admitting: Internal Medicine

## 2021-11-24 NOTE — Telephone Encounter (Signed)
Tried to call pt about status of flu shot but vm full

## 2021-12-23 ENCOUNTER — Other Ambulatory Visit: Payer: Self-pay | Admitting: Obstetrics and Gynecology

## 2021-12-23 DIAGNOSIS — Z1231 Encounter for screening mammogram for malignant neoplasm of breast: Secondary | ICD-10-CM

## 2022-01-11 ENCOUNTER — Ambulatory Visit
Admission: RE | Admit: 2022-01-11 | Discharge: 2022-01-11 | Disposition: A | Discharge status: Home / Self Care | Payer: 59 | Source: Ambulatory Visit | Attending: Obstetrics and Gynecology | Admitting: Obstetrics and Gynecology

## 2022-01-11 DIAGNOSIS — Z1231 Encounter for screening mammogram for malignant neoplasm of breast: Secondary | ICD-10-CM

## 2022-01-12 ENCOUNTER — Other Ambulatory Visit: Payer: Self-pay | Admitting: Obstetrics and Gynecology

## 2022-01-12 DIAGNOSIS — R928 Other abnormal and inconclusive findings on diagnostic imaging of breast: Secondary | ICD-10-CM

## 2022-01-26 ENCOUNTER — Other Ambulatory Visit: Payer: Self-pay

## 2022-01-26 ENCOUNTER — Other Ambulatory Visit: Payer: Self-pay | Admitting: Obstetrics and Gynecology

## 2022-01-26 ENCOUNTER — Ambulatory Visit: Payer: 59 | Admitting: Medical

## 2022-01-26 ENCOUNTER — Ambulatory Visit
Admission: RE | Admit: 2022-01-26 | Discharge: 2022-01-26 | Disposition: A | Discharge status: Home / Self Care | Payer: 59 | Source: Ambulatory Visit | Attending: Obstetrics and Gynecology | Admitting: Obstetrics and Gynecology

## 2022-01-26 DIAGNOSIS — R928 Other abnormal and inconclusive findings on diagnostic imaging of breast: Secondary | ICD-10-CM

## 2022-01-26 DIAGNOSIS — R921 Mammographic calcification found on diagnostic imaging of breast: Secondary | ICD-10-CM

## 2022-02-10 ENCOUNTER — Ambulatory Visit
Admission: RE | Admit: 2022-02-10 | Discharge: 2022-02-10 | Disposition: A | Discharge status: Home / Self Care | Payer: 59 | Source: Ambulatory Visit | Attending: Obstetrics and Gynecology | Admitting: Obstetrics and Gynecology

## 2022-02-10 DIAGNOSIS — R921 Mammographic calcification found on diagnostic imaging of breast: Secondary | ICD-10-CM

## 2022-02-10 NOTE — Progress Notes (Signed)
? ?Acute Office Visit ? ?Subjective:  ? ? Patient ID: Susan Drake, female    DOB: [birth year], 41 y.o.   MRN: [id number] ? ?Chief Complaint  ?Patient presents with  ? Acute Visit  ?  Possible Uti. She has been having lower abdominal and back pain and strong smelling urine.  ? ? ?HPI ?Patient is in today for a 2 week history of sediment in urine and stronger odor to urine; + dysuria & low back pain, which is better now; feels like she has a UTI; has some spotting that she has discussed with her Gyn; not currently on birth control;  Denies fever / chills / nausea / vomiting / diarrhea / constipation; denies history of "kidney stone"; drinks 48 ounces of water a day; previously was drinking 16 - 20 ounces of caffeine daily. ? ? ?Outpatient Medications Prior to Visit  ?Medication Sig Dispense Refill  ? fluticasone (FLONASE) 50 MCG/ACT nasal spray SPRAY 2 SPRAYS INTO EACH NOSTRIL EVERY DAY 16 mL 3  ? Multiple Vitamins-Minerals (ONE-A-DAY WOMENS PO) Take 1 tablet by mouth every other day.    ? Probiotic Product (PROBIOTIC DAILY PO) Take 1 capsule by mouth daily.     ? vitamin B-12 (CYANOCOBALAMIN) 1000 MCG tablet Take 1,000 mcg by mouth daily.    ? Cholecalciferol (VITAMIN D) 50 MCG (2000 UT) CAPS Take 1 capsule (2,000 Units total) by mouth daily. (Patient not taking: Reported on 02/11/2022) 90 capsule 3  ? ibuprofen (ADVIL) 800 MG tablet Take 800 mg by mouth every 8 (eight) hours as needed. (Patient not taking: Reported on 02/11/2022)    ? ?No facility-administered medications prior to visit.  ? ? ?No Known Allergies ? ?Review of Systems  ?Constitutional:  Negative for activity change and chills.  ?HENT:  Negative for congestion and voice change.   ?Eyes:  Negative for pain and redness.  ?Respiratory:  Negative for cough and wheezing.   ?Cardiovascular:  Negative for chest pain.  ?Gastrointestinal:  Negative for constipation, diarrhea, nausea and vomiting.  ?Endocrine: Negative for polyuria.  ?Genitourinary:  Positive for  dysuria. Negative for frequency.  ?Musculoskeletal:  Positive for back pain.  ?Skin:  Negative for color change and rash.  ?Allergic/Immunologic: Negative for immunocompromised state.  ?Neurological:  Negative for dizziness.  ?Psychiatric/Behavioral:  Negative for agitation.   ? ?   ?Objective:  ?  ?Physical Exam ?Vitals and nursing note reviewed.  ?Constitutional:   ?   General: She is not in acute distress. ?   Appearance: Normal appearance. She is not ill-appearing.  ?HENT:  ?   Head: Normocephalic and atraumatic.  ?   Right Ear: External ear normal.  ?   Left Ear: External ear normal.  ?   Nose: No congestion.  ?Eyes:  ?   Extraocular Movements: Extraocular movements intact.  ?   Conjunctiva/sclera: Conjunctivae normal.  ?   Pupils: Pupils are equal, round, and reactive to light.  ?Cardiovascular:  ?   Rate and Rhythm: Normal rate and regular rhythm.  ?   Pulses: Normal pulses.  ?   Heart sounds: Normal heart sounds.  ?Pulmonary:  ?   Effort: Pulmonary effort is normal.  ?   Breath sounds: Normal breath sounds. No wheezing.  ?Abdominal:  ?   General: Bowel sounds are normal.  ?   Palpations: Abdomen is soft.  ?Musculoskeletal:     ?   General: Normal range of motion.  ?   Cervical back: Normal range of motion and  neck supple.  ?   Right lower leg: No edema.  ?   Left lower leg: No edema.  ?Skin: ?   General: Skin is warm and dry.  ?   Findings: No bruising.  ?Neurological:  ?   General: No focal deficit present.  ?   Mental Status: She is alert and oriented to person, place, and time.  ?Psychiatric:     ?   Mood and Affect: Mood normal.     ?   Behavior: Behavior normal.     ?   Thought Content: Thought content normal.  ? ? ?BP 130/80   Pulse 75   Wt 155 lb (70.3 kg)   LMP 01/25/2022   SpO2 95%   BMI 27.46 kg/m?  ? ?Wt Readings from Last 3 Encounters:  ?02/11/22 155 lb (70.3 kg)  ?09/16/21 159 lb 9.6 oz (72.4 kg)  ?08/21/21 162 lb 6.4 oz (73.7 kg)  ? ? ?Results for orders placed or performed in visit on  02/11/22  ?POCT Urinalysis DIP (Proadvantage Device)  ?Result Value Ref Range  ? Color, UA colorless (A) yellow  ? Clarity, UA clear clear  ? Glucose, UA negative negative mg/dL  ? Bilirubin, UA negative negative  ? Ketones, POC UA negative negative mg/dL  ? Specific Gravity, Urine 1.020   ? Blood, UA small (A) negative  ? pH, UA 6.0 5.0 - 8.0  ? Protein Ur, POC negative negative mg/dL  ? Urobilinogen, Ur 0.2   ? Nitrite, UA Negative Negative  ? Leukocytes, UA Negative Negative  ? ? ?   ?Assessment & Plan:  ?1. Dysuria ?- POCT Urinalysis DIP (Proadvantage Device) ?- Urine Culture ordered ?- Drink about 64 ounces of water a day, avoid or limit caffeine, take OTC cranberry supplements which support bladder health, eat a low sugar diet, blueberries are low in sugar and also support bladder health, after urinating always wipe front to back, urinate after sex, limit sitting in a tub to take baths ? ? ?2. Spotting ?- follow up with Ob Gyn ? ? ? ?No orders of the defined types were placed in this encounter. ? ? ?Return in about 6 months (around 08/24/2022) for Return for Annual Exam. ? ?Irene Pap, PA-C ?

## 2022-02-11 ENCOUNTER — Encounter: Payer: Self-pay | Admitting: Physician Assistant

## 2022-02-11 ENCOUNTER — Ambulatory Visit (INDEPENDENT_AMBULATORY_CARE_PROVIDER_SITE_OTHER): Payer: 59 | Admitting: Physician Assistant

## 2022-02-11 VITALS — BP 130/80 | HR 75 | Ht 63.0 in | Wt 155.0 lb

## 2022-02-11 DIAGNOSIS — N920 Excessive and frequent menstruation with regular cycle: Secondary | ICD-10-CM | POA: Diagnosis not present

## 2022-02-11 DIAGNOSIS — R300 Dysuria: Secondary | ICD-10-CM | POA: Diagnosis not present

## 2022-02-11 LAB — POCT URINALYSIS DIP (PROADVANTAGE DEVICE)
Bilirubin, UA: NEGATIVE
Glucose, UA: NEGATIVE mg/dL
Ketones, POC UA: NEGATIVE mg/dL
Leukocytes, UA: NEGATIVE
Nitrite, UA: NEGATIVE
Protein Ur, POC: NEGATIVE mg/dL
Specific Gravity, Urine: 1.02
Urobilinogen, Ur: 0.2
pH, UA: 6 (ref 5.0–8.0)

## 2022-02-11 NOTE — Patient Instructions (Signed)
Drink about 64 ounces of water a day, avoid or limit caffeine, take OTC cranberry supplements which support bladder health, eat a low sugar diet, blueberries are low in sugar and also support bladder health, after urinating always wipe front to back, urinate after sex, limit sitting in a tub to take baths  ?

## 2022-02-12 LAB — URINE CULTURE: Organism ID, Bacteria: NO GROWTH

## 2022-02-19 ENCOUNTER — Ambulatory Visit: Payer: Self-pay | Admitting: Surgery

## 2022-02-19 DIAGNOSIS — N6092 Unspecified benign mammary dysplasia of left breast: Secondary | ICD-10-CM

## 2022-02-19 NOTE — H&P (Signed)
? ?Subjective  ? ?Chief Complaint: Breast Problem ?  ? ? ?History of Present Illness: ?Susan Drake is a 41 y.o. female who is seen today as an office consultation at the request of Dr. Garwin Brothers for evaluation of Breast Problem ?.   ? ?This is a 41 year old female in good health who presents with a recent history of intermittent left breast tenderness.  She underwent routine screening mammogram that revealed a 1.5 cm group of amorphous pleomorphic calcifications in the superior central left breast at medium depth.  Biopsy revealed atypical ductal hyperplasia.  She has had a previous biopsy in the same breast performed in 2015 that showed only benign breast tissue.  No family history of breast cancer. ? ?Since the biopsy, she has developed firmness likely related to hematoma.  She is being followed by hematology for thrombocytopenia. ? ? ?Review of Systems: ?A complete review of systems was obtained from the patient.  I have reviewed this information and discussed as appropriate with the patient.  See HPI as well for other ROS. ? ?ROS  ? ? ?Medical History: ?No past medical history on file. ? ?Patient Active Problem List  ?Diagnosis  ? Atypical ductal hyperplasia of left breast  ? ? ?No past surgical history on file.  ? ?Allergies  ?Allergen Reactions  ? Adhesive Tape-Silicones Itching  ? ? ?No current outpatient medications on file prior to visit.  ? ?No current facility-administered medications on file prior to visit.  ? ? ?No family history on file.  ? ?Social History  ? ?Tobacco Use  ?Smoking Status Not on file  ?Smokeless Tobacco Not on file  ?  ? ?Social History  ? ?Socioeconomic History  ? Marital status: Married  ? ? ?Objective:  ? ? ?Vitals:  ? 02/19/22 1116  ?BP: 120/72  ?Pulse: (!) 115  ?Temp: 37.1 ?C (98.7 ?F)  ?SpO2: 97%  ?Weight: 70.1 kg (154 lb 8 oz)  ?Height: 160 cm ('5\' 3"'$ )  ?  ?Body mass index is 27.37 kg/m?. ? ?Physical Exam  ? ?Constitutional:  WDWN in NAD, conversant, no obvious deformities; lying in  bed comfortably ?Eyes:  Pupils equal, round; sclera anicteric; moist conjunctiva; no lid lag ?HENT:  Oral mucosa moist; good dentition  ?Neck:  No masses palpated, trachea midline; no thyromegaly ?Lungs:  CTA bilaterally; normal respiratory effort ?Breasts:  symmetric, no nipple changes; ecchymosis and palpable hematoma in upper central left breast; no other palpable masses or lymphadenopathy ?CV:  Regular rate and rhythm; no murmurs; extremities well-perfused with no edema ?Abd:  +bowel sounds, soft, non-tender, no palpable organomegaly; no palpable hernias ?Musc:  Normal gait; no apparent clubbing or cyanosis in extremities ?Lymphatic:  No palpable cervical or axillary lymphadenopathy ?Skin:  Warm, dry; no sign of jaundice ?Psychiatric - alert and oriented x 4; calm mood and affect ? ? ?Labs, Imaging and Diagnostic Testing: ?CLINICAL DATA:  41 year old female recalled from screening mammogram ?dated 01/11/2022 for possible left breast calcifications. ?  ?EXAM: ?DIGITAL DIAGNOSTIC UNILATERAL LEFT MAMMOGRAM WITH CAD ?  ?TECHNIQUE: ?Left digital diagnostic mammography was performed. Mammographic ?images were processed with CAD. ?  ?COMPARISON:  Previous exam(s). ?  ?ACR Breast Density Category b: There are scattered areas of ?fibroglandular density. ?  ?FINDINGS: ?There is a 1.5 cm group of amorphous and pleomorphic calcifications ?in the superior central left breast at mid depth. These have an ?indeterminate morphology. ?  ?IMPRESSION: ?Indeterminate left breast calcifications. Recommend stereotactic ?biopsy. ?  ?RECOMMENDATION: ?Stereotactic biopsy of the left breast. ?  ?  I have discussed the findings and recommendations with the patient. ?If applicable, a reminder letter will be sent to the patient ?regarding the next appointment. ?  ?BI-RADS CATEGORY  4: Suspicious. ?  ?  ?Electronically Signed ?  By: Kristopher Oppenheim M.D. ?  On: 01/26/2022 16:19 ? ?Assessment and Plan:  ?Diagnoses and all orders for this  visit: ? ?Atypical ductal hyperplasia of left breast ? ? We discussed the significance of the diagnosis of ADH.  Excision is recommended.   ? ?Left radioactive seed localized lumpectomy.  The surgical procedure has been discussed with the patient.  Potential risks, benefits, alternative treatments, and expected outcomes have been explained.  All of the patient's questions at this time have been answered.  The likelihood of reaching the patient's treatment goal is good.  The patient understand the proposed surgical procedure and wishes to proceed. ? ? ?No follow-ups on file. ? ?Lea Walbert Jearld Adjutant, MD  ?02/19/2022 ?11:17 AM ? ? ? ? ? ?

## 2022-02-24 ENCOUNTER — Other Ambulatory Visit: Payer: Self-pay | Admitting: Surgery

## 2022-02-24 DIAGNOSIS — N6092 Unspecified benign mammary dysplasia of left breast: Secondary | ICD-10-CM

## 2022-03-22 ENCOUNTER — Encounter (HOSPITAL_BASED_OUTPATIENT_CLINIC_OR_DEPARTMENT_OTHER): Payer: Self-pay | Admitting: Surgery

## 2022-03-22 ENCOUNTER — Other Ambulatory Visit: Payer: Self-pay

## 2022-03-31 ENCOUNTER — Ambulatory Visit
Admission: RE | Admit: 2022-03-31 | Discharge: 2022-03-31 | Disposition: A | Discharge status: Home / Self Care | Payer: 59 | Source: Ambulatory Visit | Attending: Surgery | Admitting: Surgery

## 2022-03-31 DIAGNOSIS — N6092 Unspecified benign mammary dysplasia of left breast: Secondary | ICD-10-CM

## 2022-03-31 NOTE — Progress Notes (Signed)

## 2022-04-01 ENCOUNTER — Ambulatory Visit (HOSPITAL_BASED_OUTPATIENT_CLINIC_OR_DEPARTMENT_OTHER)
Admission: RE | Admit: 2022-04-01 | Discharge: 2022-04-01 | Disposition: A | Discharge status: Home / Self Care | Payer: 59 | Attending: Surgery | Admitting: Surgery

## 2022-04-01 ENCOUNTER — Ambulatory Visit (HOSPITAL_BASED_OUTPATIENT_CLINIC_OR_DEPARTMENT_OTHER): Payer: 59 | Admitting: Certified Registered"

## 2022-04-01 ENCOUNTER — Ambulatory Visit
Admission: RE | Admit: 2022-04-01 | Discharge: 2022-04-01 | Disposition: A | Discharge status: Home / Self Care | Payer: 59 | Source: Ambulatory Visit | Attending: Surgery | Admitting: Surgery

## 2022-04-01 ENCOUNTER — Other Ambulatory Visit: Payer: Self-pay

## 2022-04-01 ENCOUNTER — Encounter (HOSPITAL_BASED_OUTPATIENT_CLINIC_OR_DEPARTMENT_OTHER): Payer: Self-pay | Admitting: Surgery

## 2022-04-01 ENCOUNTER — Encounter (HOSPITAL_BASED_OUTPATIENT_CLINIC_OR_DEPARTMENT_OTHER)
Admission: RE | Disposition: A | Discharge status: Home / Self Care | Payer: Self-pay | Source: Home / Self Care | Attending: Surgery

## 2022-04-01 DIAGNOSIS — Z01818 Encounter for other preprocedural examination: Secondary | ICD-10-CM

## 2022-04-01 DIAGNOSIS — N6092 Unspecified benign mammary dysplasia of left breast: Secondary | ICD-10-CM

## 2022-04-01 DIAGNOSIS — N6082 Other benign mammary dysplasias of left breast: Secondary | ICD-10-CM | POA: Insufficient documentation

## 2022-04-01 DIAGNOSIS — Z87891 Personal history of nicotine dependence: Secondary | ICD-10-CM | POA: Insufficient documentation

## 2022-04-01 HISTORY — PX: BREAST LUMPECTOMY WITH RADIOACTIVE SEED LOCALIZATION: SHX6424

## 2022-04-01 LAB — POCT PREGNANCY, URINE: Preg Test, Ur: NEGATIVE

## 2022-04-01 SURGERY — BREAST LUMPECTOMY WITH RADIOACTIVE SEED LOCALIZATION
Anesthesia: General | Site: Breast | Laterality: Left

## 2022-04-01 MED ORDER — MIDAZOLAM HCL 2 MG/2ML IJ SOLN
INTRAMUSCULAR | Status: AC
  Filled 2022-04-01: qty 2

## 2022-04-01 MED ORDER — DEXAMETHASONE SODIUM PHOSPHATE 10 MG/ML IJ SOLN
INTRAMUSCULAR | Status: AC
  Filled 2022-04-01: qty 1

## 2022-04-01 MED ORDER — DEXAMETHASONE SODIUM PHOSPHATE 4 MG/ML IJ SOLN
INTRAMUSCULAR | Status: DC | PRN
  Administered 2022-04-01: 8 mg via INTRAVENOUS

## 2022-04-01 MED ORDER — KETOROLAC TROMETHAMINE 30 MG/ML IJ SOLN
30.0000 mg | Freq: Once | INTRAMUSCULAR | Status: AC | PRN
  Administered 2022-04-01: 30 mg via INTRAVENOUS

## 2022-04-01 MED ORDER — BUPIVACAINE-EPINEPHRINE 0.25% -1:200000 IJ SOLN
INTRAMUSCULAR | Status: DC | PRN
  Administered 2022-04-01: 10 mL

## 2022-04-01 MED ORDER — ACETAMINOPHEN 500 MG PO TABS
ORAL_TABLET | ORAL | Status: AC
  Filled 2022-04-01: qty 2

## 2022-04-01 MED ORDER — OXYCODONE HCL 5 MG/5ML PO SOLN: 5.0000 mg | Freq: Once | ORAL | Status: DC | PRN

## 2022-04-01 MED ORDER — FENTANYL CITRATE (PF) 100 MCG/2ML IJ SOLN
INTRAMUSCULAR | Status: AC
  Filled 2022-04-01: qty 2

## 2022-04-01 MED ORDER — AMISULPRIDE (ANTIEMETIC) 5 MG/2ML IV SOLN: 10.0000 mg | Freq: Once | INTRAVENOUS | Status: DC | PRN

## 2022-04-01 MED ORDER — FENTANYL CITRATE (PF) 100 MCG/2ML IJ SOLN
INTRAMUSCULAR | Status: DC | PRN
  Administered 2022-04-01: 100 ug via INTRAVENOUS
  Administered 2022-04-01: 25 ug via INTRAVENOUS

## 2022-04-01 MED ORDER — LIDOCAINE 2% (20 MG/ML) 5 ML SYRINGE
INTRAMUSCULAR | Status: AC
  Filled 2022-04-01: qty 5

## 2022-04-01 MED ORDER — FENTANYL CITRATE (PF) 100 MCG/2ML IJ SOLN
25.0000 ug | INTRAMUSCULAR | Status: DC | PRN
  Administered 2022-04-01: 50 ug via INTRAVENOUS

## 2022-04-01 MED ORDER — PROPOFOL 10 MG/ML IV BOLUS
INTRAVENOUS | Status: DC | PRN
  Administered 2022-04-01: 200 mg via INTRAVENOUS

## 2022-04-01 MED ORDER — LIDOCAINE HCL (CARDIAC) PF 100 MG/5ML IV SOSY
PREFILLED_SYRINGE | INTRAVENOUS | Status: DC | PRN
  Administered 2022-04-01: 60 mg via INTRAVENOUS

## 2022-04-01 MED ORDER — CHLORHEXIDINE GLUCONATE CLOTH 2 % EX PADS: 6.0000 | MEDICATED_PAD | Freq: Once | CUTANEOUS | Status: DC

## 2022-04-01 MED ORDER — OXYCODONE HCL 5 MG PO TABS: 5.0000 mg | ORAL_TABLET | Freq: Once | ORAL | Status: DC | PRN

## 2022-04-01 MED ORDER — ACETAMINOPHEN 500 MG PO TABS
1000.0000 mg | ORAL_TABLET | ORAL | Status: AC
  Administered 2022-04-01: 1000 mg via ORAL

## 2022-04-01 MED ORDER — CEFAZOLIN SODIUM-DEXTROSE 2-4 GM/100ML-% IV SOLN
2.0000 g | INTRAVENOUS | Status: AC
  Administered 2022-04-01: 2 g via INTRAVENOUS

## 2022-04-01 MED ORDER — LACTATED RINGERS IV SOLN: INTRAVENOUS | Status: DC

## 2022-04-01 MED ORDER — ONDANSETRON HCL 4 MG/2ML IJ SOLN
INTRAMUSCULAR | Status: AC
  Filled 2022-04-01: qty 2

## 2022-04-01 MED ORDER — CEFAZOLIN SODIUM-DEXTROSE 2-4 GM/100ML-% IV SOLN
INTRAVENOUS | Status: AC
  Filled 2022-04-01: qty 100

## 2022-04-01 MED ORDER — MIDAZOLAM HCL 5 MG/5ML IJ SOLN
INTRAMUSCULAR | Status: DC | PRN
  Administered 2022-04-01: 2 mg via INTRAVENOUS

## 2022-04-01 MED ORDER — KETOROLAC TROMETHAMINE 30 MG/ML IJ SOLN
INTRAMUSCULAR | Status: AC
  Filled 2022-04-01: qty 1

## 2022-04-01 MED ORDER — ONDANSETRON HCL 4 MG/2ML IJ SOLN
INTRAMUSCULAR | Status: DC | PRN
  Administered 2022-04-01: 4 mg via INTRAVENOUS

## 2022-04-01 SURGICAL SUPPLY — 43 items
APL PRP STRL LF DISP 70% ISPRP (MISCELLANEOUS) ×1
APL SKNCLS STERI-STRIP NONHPOA (GAUZE/BANDAGES/DRESSINGS) ×1
BENZOIN TINCTURE PRP APPL 2/3 (GAUZE/BANDAGES/DRESSINGS) ×2 IMPLANT
BLADE HEX COATED 2.75 (ELECTRODE) ×2 IMPLANT
BLADE SURG 15 STRL LF DISP TIS (BLADE) ×1 IMPLANT
BLADE SURG 15 STRL SS (BLADE) ×2
CANISTER SUC SOCK COL 7IN (MISCELLANEOUS) IMPLANT
CANISTER SUCT 1200ML W/VALVE (MISCELLANEOUS) IMPLANT
CHLORAPREP W/TINT 26 (MISCELLANEOUS) ×2 IMPLANT
COVER BACK TABLE 60X90IN (DRAPES) ×2 IMPLANT
COVER MAYO STAND STRL (DRAPES) ×2 IMPLANT
COVER PROBE W GEL 5X96 (DRAPES) ×2 IMPLANT
DRAPE LAPAROTOMY 100X72 PEDS (DRAPES) ×2 IMPLANT
DRAPE UTILITY XL STRL (DRAPES) ×2 IMPLANT
DRSG TEGADERM 4X4.75 (GAUZE/BANDAGES/DRESSINGS) ×2 IMPLANT
ELECT REM PT RETURN 9FT ADLT (ELECTROSURGICAL) ×2
ELECTRODE REM PT RTRN 9FT ADLT (ELECTROSURGICAL) ×1 IMPLANT
GAUZE SPONGE 4X4 12PLY STRL LF (GAUZE/BANDAGES/DRESSINGS) ×1 IMPLANT
GLOVE BIO SURGEON STRL SZ 6.5 (GLOVE) ×1 IMPLANT
GLOVE BIO SURGEON STRL SZ7 (GLOVE) ×3 IMPLANT
GLOVE BIOGEL PI IND STRL 7.0 (GLOVE) IMPLANT
GLOVE BIOGEL PI IND STRL 7.5 (GLOVE) ×1 IMPLANT
GLOVE BIOGEL PI INDICATOR 7.0 (GLOVE) ×1
GLOVE BIOGEL PI INDICATOR 7.5 (GLOVE) ×1
GLOVE SURG SS PI 6.5 STRL IVOR (GLOVE) ×1 IMPLANT
GOWN STRL REUS W/ TWL LRG LVL3 (GOWN DISPOSABLE) ×2 IMPLANT
GOWN STRL REUS W/TWL LRG LVL3 (GOWN DISPOSABLE) ×8
KIT MARKER MARGIN INK (KITS) ×2 IMPLANT
NDL HYPO 25X1 1.5 SAFETY (NEEDLE) ×1 IMPLANT
NEEDLE HYPO 25X1 1.5 SAFETY (NEEDLE) ×2 IMPLANT
NS IRRIG 1000ML POUR BTL (IV SOLUTION) ×2 IMPLANT
PACK BASIN DAY SURGERY FS (CUSTOM PROCEDURE TRAY) ×2 IMPLANT
PENCIL SMOKE EVACUATOR (MISCELLANEOUS) ×2 IMPLANT
SLEEVE SCD COMPRESS KNEE MED (STOCKING) ×2 IMPLANT
SPONGE T-LAP 4X18 ~~LOC~~+RFID (SPONGE) ×2 IMPLANT
STRIP CLOSURE SKIN 1/2X4 (GAUZE/BANDAGES/DRESSINGS) ×2 IMPLANT
SUT MON AB 4-0 PC3 18 (SUTURE) ×2 IMPLANT
SUT SILK 2 0 SH (SUTURE) IMPLANT
SUT VIC AB 3-0 SH 27 (SUTURE) ×2
SUT VIC AB 3-0 SH 27X BRD (SUTURE) ×1 IMPLANT
SYR CONTROL 10ML LL (SYRINGE) ×2 IMPLANT
TOWEL GREEN STERILE FF (TOWEL DISPOSABLE) ×2 IMPLANT
TRAY FAXITRON CT DISP (TRAY / TRAY PROCEDURE) ×2 IMPLANT

## 2022-04-01 NOTE — Anesthesia Postprocedure Evaluation (Signed)
Anesthesia Post Note  Patient: Susan Drake  Procedure(s) Performed: LEFT BREAST LUMPECTOMY WITH RADIOACTIVE SEED LOCALIZATION (Left: Breast)     Patient location during evaluation: PACU Anesthesia Type: General Level of consciousness: awake Pain management: pain level controlled Vital Signs Assessment: post-procedure vital signs reviewed and stable Respiratory status: spontaneous breathing, nonlabored ventilation, respiratory function stable and patient connected to nasal cannula oxygen Cardiovascular status: blood pressure returned to baseline and stable Postop Assessment: no apparent nausea or vomiting Anesthetic complications: no   No notable events documented.  Last Vitals:  Vitals:   04/01/22 1030 04/01/22 1051  BP: 115/79 129/82  Pulse: 77 68  Resp: 20 20  Temp:  36.4 C  SpO2: 99% 99%    Last Pain:  Vitals:   04/01/22 1047  TempSrc:   PainSc: 0-No pain                 Ancil Dewan P Faustine Tates

## 2022-04-01 NOTE — Discharge Instructions (Addendum)
Post Anesthesia Home Care Instructions  Activity: Get plenty of rest for the remainder of the day. A responsible individual must stay with you for 24 hours following the procedure.  For the next 24 hours, DO NOT: -Drive a car -Paediatric nurse -Drink alcoholic beverages -Take any medication unless instructed by your physician -Make any legal decisions or sign important papers.  Meals: Start with liquid foods such as gelatin or soup. Progress to regular foods as tolerated. Avoid greasy, spicy, heavy foods. If nausea and/or vomiting occur, drink only clear liquids until the nausea and/or vomiting subsides. Call your physician if vomiting continues.  Special Instructions/Symptoms: Your throat may feel dry or sore from the anesthesia or the breathing tube placed in your throat during surgery. If this causes discomfort, gargle with warm salt water. The discomfort should disappear within 24 hours.  If you had a scopolamine patch placed behind your ear for the management of post- operative nausea and/or vomiting:  1. The medication in the patch is effective for 72 hours, after which it should be removed.  Wrap patch in a tissue and discard in the trash. Wash hands thoroughly with soap and water. 2. You may remove the patch earlier than 72 hours if you experience unpleasant side effects which may include dry mouth, dizziness or visual disturbances. 3. Avoid touching the patch. Wash your hands with soap and water after contact with the patch.       [facility]    Next dose of Tylenol may be given at [employer] Number [phone]  BREAST BIOPSY/ PARTIAL MASTECTOMY: POST OP INSTRUCTIONS  Always review your discharge instruction sheet given to you by the facility where your surgery was performed.  IF YOU HAVE DISABILITY OR FAMILY LEAVE FORMS, YOU MUST BRING THEM TO THE OFFICE FOR PROCESSING.  DO NOT GIVE THEM TO YOUR DOCTOR.  A prescription for pain medication may  be given to you upon discharge.  Take your pain medication as prescribed, if needed.  If narcotic pain medicine is not needed, then you may take acetaminophen (Tylenol) or ibuprofen (Advil) as needed. Take your usually prescribed medications unless otherwise directed If you need a refill on your pain medication, please contact your pharmacy.  They will contact our office to request authorization.  Prescriptions will not be filled after 5pm or on week-ends. You should eat very light the first 24 hours after surgery, such as soup, crackers, pudding, etc.  Resume your normal diet the day after surgery. Most patients will experience some swelling and bruising in the breast.  Ice packs and a good support bra will help.  Swelling and bruising can take several days to resolve.  It is common to experience some constipation if taking pain medication after surgery.  Increasing fluid intake and taking a stool softener will usually help or prevent this problem from occurring.  A mild laxative (Milk of Magnesia or Miralax) should be taken according to package directions if there are no bowel movements after 48 hours. Unless discharge instructions indicate otherwise, you may remove your bandages 24-48 hours after surgery, and you may shower at that time.  You may have steri-strips (small skin tapes) in place directly over the incision.  These strips should be left on the skin for 7-10 days.  If your surgeon used skin glue on the incision, you may shower in 24 hours.  The glue will flake off over the next 2-3 weeks.  Any sutures or staples will be removed at the  office during your follow-up visit. ACTIVITIES:  You may resume regular daily activities (gradually increasing) beginning the next day.  Wearing a good support bra or sports bra minimizes pain and swelling.  You may have sexual intercourse when it is comfortable. You may drive when you no longer are taking prescription pain medication, you can comfortably wear a  seatbelt, and you can safely maneuver your car and apply brakes. RETURN TO WORK:  ______________________________________________________________________________________ Dennis Bast should see your doctor in the office for a follow-up appointment approximately two weeks after your surgery.  Your doctor's nurse will typically make your follow-up appointment when she calls you with your pathology report.  Expect your pathology report 2-3 business days after your surgery.  You may call to check if you do not hear from [place] after three days. OTHER INSTRUCTIONS: _______________________________________________________________________________________________ _____________________________________________________________________________________________________________________________________ _____________________________________________________________________________________________________________________________________ _____________________________________________________________________________________________________________________________________  WHEN TO CALL YOUR DOCTOR: Fever over 101.0 Nausea and/or vomiting. Extreme swelling or bruising. Continued bleeding from incision. Increased pain, redness, or drainage from the incision.  The clinic staff is available to answer your questions during regular business hours.  Please don't hesitate to call and ask to speak to one of the nurses for clinical concerns.  If you have a medical emergency, go to the nearest emergency room or call 911.  A surgeon from [facility] is always on call at the hospital.  For further questions, please visit [email or web address]  No Ibuprofen before 4pm 04/01/22.  No Tylenol before 2pm 04/01/22.

## 2022-04-01 NOTE — Transfer of Care (Signed)
Immediate Anesthesia Transfer of Care Note  Patient: Susan Drake  Procedure(s) Performed: LEFT BREAST LUMPECTOMY WITH RADIOACTIVE SEED LOCALIZATION (Left: Breast)  Patient Location: PACU  Anesthesia Type:General  Level of Consciousness: sedated  Airway & Oxygen Therapy: Patient Spontanous Breathing and Patient connected to face mask oxygen  Post-op Assessment: Report given to RN and Post -op Vital signs reviewed and stable  Post vital signs: Reviewed and stable  Last Vitals:  Vitals Value Taken Time  BP    Temp    Pulse    Resp    SpO2      Last Pain:  Vitals:   04/01/22 0747  TempSrc: Oral  PainSc: 0-No pain      Patients Stated Pain Goal: 4 (43/14/27 6701)  Complications: No notable events documented.

## 2022-04-01 NOTE — Anesthesia Procedure Notes (Signed)
Procedure Name: LMA Insertion Date/Time: 04/01/2022 8:49 AM Performed by: Tawni Millers, CRNA Pre-anesthesia Checklist: Patient identified, Emergency Drugs available, Suction available and Patient being monitored Patient Re-evaluated:Patient Re-evaluated prior to induction Oxygen Delivery Method: Circle system utilized Preoxygenation: Pre-oxygenation with 100% oxygen Induction Type: IV induction Ventilation: Mask ventilation without difficulty LMA: LMA inserted LMA Size: 4.0 Number of attempts: 1 Airway Equipment and Method: Bite block Placement Confirmation: positive ETCO2 Tube secured with: Tape Dental Injury: Teeth and Oropharynx as per pre-operative assessment

## 2022-04-01 NOTE — H&P (Signed)
Subjective    Chief Complaint: Breast Problem       History of Present Illness: Susan Drake is a 41 y.o. female who is seen today as an office consultation at the request of Dr. Garwin Brothers for evaluation of Breast Problem .     This is a 41 year old female in good health who presents with a recent history of intermittent left breast tenderness.  She underwent routine screening mammogram that revealed a 1.5 cm group of amorphous pleomorphic calcifications in the superior central left breast at medium depth.  Biopsy revealed atypical ductal hyperplasia.  She has had a previous biopsy in the same breast performed in 2015 that showed only benign breast tissue.  No family history of breast cancer.   Since the biopsy, she has developed firmness likely related to hematoma.  She is being followed by hematology for thrombocytopenia.     Review of Systems: A complete review of systems was obtained from the patient.  I have reviewed this information and discussed as appropriate with the patient.  See HPI as well for other ROS.   ROS      Medical History: No past medical history on file.      Patient Active Problem List  Diagnosis   Atypical ductal hyperplasia of left breast      No past surgical history on file.        Allergies  Allergen Reactions   Adhesive Tape-Silicones Itching      No current outpatient medications on file prior to visit.    No current facility-administered medications on file prior to visit.      No family history on file.    Social History       Tobacco Use  Smoking Status Not on file  Smokeless Tobacco Not on file      Social History        Socioeconomic History   Marital status: Married      Objective:         Vitals:     BP: 120/72  Pulse: (!) 115  Temp: 37.1 C (98.7 F)  SpO2: 97%  Weight: 70.1 kg (154 lb 8 oz)  Height: 160 cm ('5\' 3"'$ )    Body mass index is 27.37 kg/m.   Physical Exam    Constitutional:  WDWN in NAD,  conversant, no obvious deformities; lying in bed comfortably Eyes:  Pupils equal, round; sclera anicteric; moist conjunctiva; no lid lag HENT:  Oral mucosa moist; good dentition  Neck:  No masses palpated, trachea midline; no thyromegaly Lungs:  CTA bilaterally; normal respiratory effort Breasts:  symmetric, no nipple changes; ecchymosis and palpable hematoma in upper central left breast; no other palpable masses or lymphadenopathy CV:  Regular rate and rhythm; no murmurs; extremities well-perfused with no edema Abd:  +bowel sounds, soft, non-tender, no palpable organomegaly; no palpable hernias Musc:  Normal gait; no apparent clubbing or cyanosis in extremities Lymphatic:  No palpable cervical or axillary lymphadenopathy Skin:  Warm, dry; no sign of jaundice Psychiatric - alert and oriented x 4; calm mood and affect     Labs, Imaging and Diagnostic Testing: CLINICAL DATA:  41 year old female recalled from screening mammogram dated 01/11/2022 for possible left breast calcifications.   EXAM: DIGITAL DIAGNOSTIC UNILATERAL LEFT MAMMOGRAM WITH CAD   TECHNIQUE: Left digital diagnostic mammography was performed. Mammographic images were processed with CAD.   COMPARISON:  Previous exam(s).   ACR Breast Density Category b: There are scattered areas of fibroglandular density.  FINDINGS: There is a 1.5 cm group of amorphous and pleomorphic calcifications in the superior central left breast at mid depth. These have an indeterminate morphology.   IMPRESSION: Indeterminate left breast calcifications. Recommend stereotactic biopsy.   RECOMMENDATION: Stereotactic biopsy of the left breast.   I have discussed the findings and recommendations with the patient. If applicable, a reminder letter will be sent to the patient regarding the next appointment.   BI-RADS CATEGORY  4: Suspicious.     Electronically Signed   By: Kristopher Oppenheim M.D.   On: 01/26/2022 16:19   Assessment and  Plan:  Diagnoses and all orders for this visit:   Atypical ductal hyperplasia of left breast    We discussed the significance of the diagnosis of ADH.  Excision is recommended.     Left radioactive seed localized lumpectomy.  The surgical procedure has been discussed with the patient.  Potential risks, benefits, alternative treatments, and expected outcomes have been explained.  All of the patient's questions at this time have been answered.  The likelihood of reaching the patient's treatment goal is good.  The patient understand the proposed surgical procedure and wishes to proceed.    Imogene Burn. Georgette Dover, MD, [facility]  General Surgery   04/01/2022 7:26 AM

## 2022-04-01 NOTE — Op Note (Addendum)
Pre-op Diagnosis:  Left breast atypical ductal hyperplasia Post-op Diagnosis: same Procedure:  Left radioactive seed localized lumpectomy Surgeon:  Hamlet Lasecki K. Resident:  Dr. Radonna Ricker I was personally present during the key and critical portions of this procedure and immediately available throughout the entire procedure, as documented in my operative note.  Anesthesia:  GEN - LMA Indications:  This is a 41 year old female in good health who presents with a recent history of intermittent left breast tenderness.  She underwent routine screening mammogram that revealed a 1.5 cm group of amorphous pleomorphic calcifications in the superior central left breast at medium depth.  Biopsy revealed atypical ductal hyperplasia.  She has had a previous biopsy in the same breast performed in 2015 that showed only benign breast tissue.  No family history of breast cancer.  Description of procedure: The patient is brought to the operating room placed in supine position on the operating room table. After an adequate level of general anesthesia was obtained, her left breast was prepped with ChloraPrep and draped in sterile fashion. A timeout was taken to ensure the proper patient and proper procedure. We interrogated the breast with the neoprobe. We made a circumareolar incision around the upepr side of the nipple after infiltrating with 0.25% Marcaine. Dissection was carried down in the breast tissue with cautery. We used the neoprobe to guide [place] towards the radioactive seed. We excised an area of tissue around the radioactive seed 2 cm in diameter. The specimen was removed and was oriented with a paint kit. Specimen mammogram showed the biopsy clip within the specimen. The seed had become dislodged but was removed separately.  This was sent for pathologic examination. There is no residual radioactivity within the biopsy cavity. We inspected carefully for hemostasis. The wound was thoroughly irrigated. The wound  was closed with a deep layer of 3-0 Vicryl and a subcuticular layer of 4-0 Monocryl. Benzoin Steri-Strips were applied. The patient was then extubated and brought to the recovery room in stable condition. All sponge, instrument, and needle counts are correct.  Imogene Burn. Georgette Dover, MD, [facility]  General/ Trauma Surgery  04/01/2022 9:25 AM

## 2022-04-01 NOTE — Anesthesia Preprocedure Evaluation (Signed)
Anesthesia Evaluation  Patient identified by MRN, date of birth, ID band Patient awake    Reviewed: Allergy & Precautions, NPO status , Patient's Chart, lab work & pertinent test results  Airway Mallampati: I  TM Distance: >3 FB Neck ROM: Full    Dental no notable dental hx.    Pulmonary former smoker,    Pulmonary exam normal        Cardiovascular negative cardio ROS Normal cardiovascular exam     Neuro/Psych Anxiety negative neurological ROS     GI/Hepatic negative GI ROS, Neg liver ROS,   Endo/Other  negative endocrine ROS  Renal/GU negative Renal ROS     Musculoskeletal negative musculoskeletal ROS (+)   Abdominal   Peds  Hematology Thrombocytopenia    Anesthesia Other Findings LEFT BREAST ATYPICAL DUCTAL HYPERPLASIA  Reproductive/Obstetrics hcg negative                             Anesthesia Physical Anesthesia Plan  ASA: 2  Anesthesia Plan: General   Post-op Pain Management:    Induction: Intravenous  PONV Risk Score and Plan: 3 and Ondansetron, Dexamethasone, Midazolam and Treatment may vary due to age or medical condition  Airway Management Planned: LMA  Additional Equipment:   Intra-op Plan:   Post-operative Plan: Extubation in OR  Informed Consent: I have reviewed the patients History and Physical, chart, labs and discussed the procedure including the risks, benefits and alternatives for the proposed anesthesia with the patient or authorized representative who has indicated his/her understanding and acceptance.     Dental advisory given  Plan Discussed with: CRNA  Anesthesia Plan Comments:         Anesthesia Quick Evaluation

## 2022-04-02 ENCOUNTER — Encounter (HOSPITAL_BASED_OUTPATIENT_CLINIC_OR_DEPARTMENT_OTHER): Payer: Self-pay | Admitting: Surgery

## 2022-04-05 LAB — SURGICAL PATHOLOGY

## 2022-04-07 ENCOUNTER — Inpatient Hospital Stay: Payer: 59

## 2022-04-07 ENCOUNTER — Inpatient Hospital Stay: Payer: 59 | Admitting: Oncology

## 2022-04-15 ENCOUNTER — Other Ambulatory Visit: Payer: Self-pay

## 2022-04-15 ENCOUNTER — Inpatient Hospital Stay: Payer: 59 | Attending: Oncology

## 2022-04-15 DIAGNOSIS — N6092 Unspecified benign mammary dysplasia of left breast: Secondary | ICD-10-CM | POA: Insufficient documentation

## 2022-04-15 DIAGNOSIS — Z808 Family history of malignant neoplasm of other organs or systems: Secondary | ICD-10-CM | POA: Insufficient documentation

## 2022-04-15 DIAGNOSIS — Z803 Family history of malignant neoplasm of breast: Secondary | ICD-10-CM | POA: Insufficient documentation

## 2022-04-15 DIAGNOSIS — D696 Thrombocytopenia, unspecified: Secondary | ICD-10-CM | POA: Diagnosis present

## 2022-04-15 DIAGNOSIS — Z807 Family history of other malignant neoplasms of lymphoid, hematopoietic and related tissues: Secondary | ICD-10-CM | POA: Insufficient documentation

## 2022-04-15 DIAGNOSIS — Z8051 Family history of malignant neoplasm of kidney: Secondary | ICD-10-CM | POA: Diagnosis not present

## 2022-04-15 LAB — COMPREHENSIVE METABOLIC PANEL
ALT: 15 U/L (ref 0–44)
AST: 19 U/L (ref 15–41)
Albumin: 4.3 g/dL (ref 3.5–5.0)
Alkaline Phosphatase: 48 U/L (ref 38–126)
Anion gap: 6 (ref 5–15)
BUN: 13 mg/dL (ref 6–20)
CO2: 25 mmol/L (ref 22–32)
Calcium: 8.6 mg/dL — ABNORMAL LOW (ref 8.9–10.3)
Chloride: 104 mmol/L (ref 98–111)
Creatinine, Ser: 0.8 mg/dL (ref 0.44–1.00)
GFR, Estimated: >60 mL/min (ref >60)
Glucose, Bld: 85 mg/dL (ref 70–99)
Potassium: 3.8 mmol/L (ref 3.5–5.1)
Sodium: 135 mmol/L (ref 135–145)
Total Bilirubin: 0.6 mg/dL (ref 0.3–1.2)
Total Protein: 7.5 g/dL (ref 6.5–8.1)

## 2022-04-15 LAB — CBC WITH DIFFERENTIAL/PLATELET
Abs Immature Granulocytes: 0.02 10*3/uL (ref 0.00–0.07)
Basophils Absolute: 0 10*3/uL (ref 0.0–0.1)
Basophils Relative: 0 %
Eosinophils Absolute: 0.1 10*3/uL (ref 0.0–0.5)
Eosinophils Relative: 1 %
HCT: 38.6 % (ref 36.0–46.0)
Hemoglobin: 12.3 g/dL (ref 12.0–15.0)
Immature Granulocytes: 0 %
Lymphocytes Relative: 28 %
Lymphs Abs: 2.1 10*3/uL (ref 0.7–4.0)
MCH: 26.5 pg (ref 26.0–34.0)
MCHC: 31.9 g/dL (ref 30.0–36.0)
MCV: 83 fL (ref 80.0–100.0)
Monocytes Absolute: 0.6 10*3/uL (ref 0.1–1.0)
Monocytes Relative: 8 %
Neutro Abs: 4.8 10*3/uL (ref 1.7–7.7)
Neutrophils Relative %: 63 %
Platelets: 142 10*3/uL — ABNORMAL LOW (ref 150–400)
RBC: 4.65 MIL/uL (ref 3.87–5.11)
RDW: 13.2 % (ref 11.5–15.5)
Smear Review: NORMAL
WBC: 7.7 10*3/uL (ref 4.0–10.5)
nRBC: 0 % (ref 0.0–0.2)

## 2022-04-15 LAB — IMMATURE PLATELET FRACTION: Immature Platelet Fraction: 18.2 % — ABNORMAL HIGH (ref 1.2–8.6)

## 2022-04-15 LAB — VITAMIN B12: Vitamin B-12: 1052 pg/mL — ABNORMAL HIGH (ref 180–914)

## 2022-04-15 LAB — LACTATE DEHYDROGENASE: LDH: 142 U/L (ref 98–192)

## 2022-04-19 ENCOUNTER — Encounter: Payer: Self-pay | Admitting: Oncology

## 2022-04-19 ENCOUNTER — Inpatient Hospital Stay (HOSPITAL_BASED_OUTPATIENT_CLINIC_OR_DEPARTMENT_OTHER): Payer: 59 | Admitting: Oncology

## 2022-04-19 ENCOUNTER — Other Ambulatory Visit: Payer: 59

## 2022-04-19 VITALS — BP 129/96 | HR 92 | Temp 97.6°F | Wt 158.0 lb

## 2022-04-19 DIAGNOSIS — N6092 Unspecified benign mammary dysplasia of left breast: Secondary | ICD-10-CM

## 2022-04-19 DIAGNOSIS — Z809 Family history of malignant neoplasm, unspecified: Secondary | ICD-10-CM | POA: Diagnosis not present

## 2022-04-19 DIAGNOSIS — D696 Thrombocytopenia, unspecified: Secondary | ICD-10-CM

## 2022-04-20 ENCOUNTER — Ambulatory Visit: Payer: Self-pay | Admitting: Surgery

## 2022-04-20 NOTE — Progress Notes (Signed)
Hematology/Oncology Progress note [phone] [phone]      Patient Care Team: Tysinger, Camelia Eng, PA-C as PCP - General (Family Medicine)  REFERRING PROVIDER: Carlena Hurl, PA-C  CHIEF COMPLAINTS/REASON FOR VISIT:   thrombocytopenia  HISTORY OF PRESENTING ILLNESS:  Susan Drake is a 41 y.o. female who was seen in consultation at the request of Tysinger, Camelia Eng, PA-C for evaluation of thrombocytopenia   Reviewed patient's labs done previously.  08/21/2021 labs showed decreased platelet counts at 136,000. Normal wbc normal hemoglobin  Reviewed patient's previous labs. Thrombocytopenia is intermittent, chronic onset , since at least 2013 No aggravating or elevated factors.  Associated symptoms or signs:  Denies weight loss, fever, chills, fatigue, night sweats.  Denies hematochezia, hematuria, hematemesis, epistaxis, black tarry stool. + easy bruising on her bilateral lower extremities..   Patient drinks alcohol very occasionally. Patient denies any bleeding complications of the previous myomectomy.  Patient has had wisdom teeth extraction and she recalls that she had some prolonged bleeding after the procedure.  INTERVAL HISTORY Susan Drake is a 41 y.o. female who has above history reviewed by me today presents for follow up visit for thrombocytopenia, recent diagnosis of atypical ductal hyperplasia.  01/26/2022, mammogram left diagnostic showed indeterminate left breast calcification.  Recommend stereo tactic biopsy 02/10/2022, stereotactic biopsy showed focal atypical ductal hyperplasia.  Fibrocystic changes with calcifications. 04/01/2022, left breast lumpectomy showed benign breast tissue with fibrocystic changes, coronary surgeon, fibroadenomatoid change, apocrine metaplasia and focal usual ductal hyperplasia.  Negative for atypia/malignancy in sections examined.  The patient reports feeling well.  No concerns at the surgical sites.  No bleeding  events.  Easy bruising has improved.  She is on vitamin B12 supplementation daily   Review of Systems  Constitutional:  Negative for appetite change, chills, fatigue and fever.  HENT:   Negative for hearing loss and voice change.   Eyes:  Negative for eye problems.  Respiratory:  Negative for chest tightness and cough.   Cardiovascular:  Negative for chest pain.  Gastrointestinal:  Negative for abdominal distention, abdominal pain and blood in stool.  Endocrine: Negative for hot flashes.  Genitourinary:  Negative for difficulty urinating and frequency.   Musculoskeletal:  Negative for arthralgias.  Skin:  Negative for itching and rash.  Neurological:  Negative for extremity weakness.  Hematological:  Negative for adenopathy. Does not bruise/bleed easily.  Psychiatric/Behavioral:  Negative for confusion.     MEDICAL HISTORY:  Past Medical History:  Diagnosis Date   Anxiety    Constipation    sees Dr. Juanita Craver   Dermatological disease    sees Dr. Allyson Sabal, bilat upper arms and chest   Diverticulosis    Mild case   History of chlamydia    hx/o trichomonas and chlamydia in remote past   Liver cyst    Seasonal allergic rhinitis    Uterine fibroid     SURGICAL HISTORY: Past Surgical History:  Procedure Laterality Date   BREAST BIOPSY Left    BREAST LUMPECTOMY WITH RADIOACTIVE SEED LOCALIZATION Left 04/01/2022   Procedure: LEFT BREAST LUMPECTOMY WITH RADIOACTIVE SEED LOCALIZATION;  Surgeon: Donnie Mesa, MD;  Location: [facility];  Service: General;  Laterality: Left;   COLONOSCOPY     no prior colonoscopy   MYOMECTOMY  05/03/2012   Procedure: MYOMECTOMY;  Surgeon: Marvene Staff, MD;  Location: [facility];  Service: Gynecology;  Laterality: N/A;   Exploratory Laparotomy myomectomy. Open at 1410.   MYOMECTOMY  06/21/2019  Exploratory Laparotomy MYOMECTOMY (N/A Abdomen)   MYOMECTOMY N/A 06/21/2019   Procedure: Exploratory Laparotomy MYOMECTOMY;  Surgeon:  Servando Salina, MD;  Location: [facility];  Service: Gynecology;  Laterality: N/A;   ROBOT ASSISTED MYOMECTOMY  05/03/2012   Procedure: ROBOTIC ASSISTED MYOMECTOMY;  Surgeon: Marvene Staff, MD;  Location: [facility];  Service: Gynecology;  Laterality: N/A;  Times two fibroids with robot.   WISDOM TOOTH EXTRACTION  2011    SOCIAL HISTORY: Social History   Socioeconomic History   Marital status: Married    Spouse name: Not on file   Number of children: Not on file   Years of education: Not on file   Highest education level: Not on file  Occupational History   Not on file  Tobacco Use   Smoking status: Former    Types: Cigars   Smokeless tobacco: Never   Tobacco comments:    cigars occaionally  Vaping Use   Vaping Use: Never used  Substance and Sexual Activity   Alcohol use: Yes    Comment: socially   Drug use: No   Sexual activity: Not on file  Other Topics Concern   Not on file  Social History Narrative   Married, no children, exercise - some with treadmill, cardio, goes to gym, works at [employer]  08/2021   Social Determinants of Health   Financial Resource Strain: Not on file  Food Insecurity: Not on file  Transportation Needs: Not on file  Physical Activity: Not on file  Stress: Not on file  Social Connections: Not on file  Intimate Partner Violence: Not on file    FAMILY HISTORY: Family History  Problem Relation Age of Onset   COPD Mother    Cancer Mother        non hodgkins lymphoma   Appendicitis Mother    Non-Hodgkin's lymphoma Mother    Aortic aneurysm Mother    COPD Father        emphysema   Hypertension Father    Eczema Father    Kidney disease Sister    Kidney cancer Sister    Cerebral palsy Sister    Fibroids Sister    Arthritis Brother    Cancer Maternal Uncle        liver   Breast cancer Paternal Grandmother    Hypertension Cousin    Heart disease Neg Hx    Stroke Neg Hx    Diabetes Neg Hx     ALLERGIES:  is allergic to  other.  MEDICATIONS:  Current Outpatient Medications  Medication Sig Dispense Refill   fluticasone (FLONASE) 50 MCG/ACT nasal spray SPRAY 2 SPRAYS INTO EACH NOSTRIL EVERY DAY 16 mL 3   Multiple Vitamins-Minerals (ONE-A-DAY WOMENS PO) Take 1 tablet by mouth every other day.     Probiotic Product (PROBIOTIC DAILY PO) Take 1 capsule by mouth daily.      vitamin B-12 (CYANOCOBALAMIN) 1000 MCG tablet Take 1,000 mcg by mouth daily.     No current facility-administered medications for this visit.     PHYSICAL EXAMINATION: ECOG PERFORMANCE STATUS: 0 - Asymptomatic Vitals:   04/19/22 1439  BP: (!) 129/96  Pulse: 92  Temp: 97.6 F (36.4 C)   Filed Weights   04/19/22 1439  Weight: 158 lb (71.7 kg)     Physical Exam Constitutional:      General: She is not in acute distress. HENT:     Head: Normocephalic and atraumatic.  Eyes:     General: No scleral icterus. Cardiovascular:  Rate and Rhythm: Normal rate.  Pulmonary:     Effort: Pulmonary effort is normal. No respiratory distress.  Abdominal:     General: There is no distension.  Musculoskeletal:        General: No deformity. Normal range of motion.     Cervical back: Normal range of motion and neck supple.  Skin:    Findings: No rash.  Neurological:     Mental Status: She is alert and oriented to person, place, and time. Mental status is at baseline.     Cranial Nerves: No cranial nerve deficit.  Psychiatric:        Mood and Affect: Mood normal.      LABORATORY DATA:  I have reviewed the data as listed Lab Results  Component Value Date   WBC 7.7 04/15/2022   HGB 12.3 04/15/2022   HCT 38.6 04/15/2022   MCV 83.0 04/15/2022   PLT 142 (L) 04/15/2022   Recent Labs    08/21/21 1106 09/16/21 1202 04/15/22 1626  NA 141 135 135  K 4.4 4.3 3.8  CL 107* 102 104  CO2 '24 24 25  ' GLUCOSE 80 82 85  BUN '11 12 13  ' CREATININE 0.74 0.79 0.80  CALCIUM 8.5* 8.8* 8.6*  GFRNONAA  --  >60 >60  PROT 6.3 7.4 7.5  ALBUMIN  4.3 4.4 4.3  AST '14 18 19  ' ALT '10 15 15  ' ALKPHOS 36* 33* 48  BILITOT 0.4 0.5 0.6    Iron/TIBC/Ferritin/ %Sat    Component Value Date/Time   IRON 68 08/21/2021 1106   TIBC 276 08/21/2021 1106   FERRITIN 19 08/21/2021 1106   IRONPCTSAT 25 08/21/2021 1106     08/21/2021 HIV negative, hepatitis C negative   RADIOGRAPHIC STUDIES: I have personally reviewed the radiological images as listed and agreed with the findings in the report.  MM Breast Surgical Specimen  Result Date: 04/01/2022 CLINICAL DATA:  Post left breast excision. EXAM: SPECIMEN RADIOGRAPH OF THE LEFT BREAST COMPARISON:  Previous exams. FINDINGS: Status post excision of the left breast. The X shaped biopsy marking clip is present, completely intact, andmarked for pathology. The radioactive seed is present, submitted in a separate specimen container. IMPRESSION: Specimen radiograph of the left breast. Electronically Signed   By: Everlean Alstrom M.D.   On: 04/01/2022 09:23  MM LT RADIOACTIVE SEED LOC MAMMO GUIDE  Result Date: 03/31/2022 CLINICAL DATA:  Patient with a LEFT breast atypical ductal hyperplasia scheduled for surgical excision requiring preoperative radioactive seed localization. EXAM: MAMMOGRAPHIC GUIDED RADIOACTIVE SEED LOCALIZATION OF THE LEFT BREAST COMPARISON:  Previous exams including stereotactic biopsy performed on 02/10/2022. FINDINGS: Patient presents for radioactive seed localization prior to surgical excision. I met with the patient and we discussed the procedure of seed localization including benefits and alternatives. We discussed the high likelihood of a successful procedure. We discussed the risks of the procedure including infection, bleeding, tissue injury and further surgery. We discussed the low dose of radioactivity involved in the procedure. Informed, written consent was given. The usual time-out protocol was performed immediately prior to the procedure. Using mammographic guidance, sterile technique,  1% lidocaine and an I-125 radioactive seed, the X shaped clip within the upper LEFT breast was localized using a superior approach. The follow-up mammogram images confirm the seed in the expected location and were marked for Dr. Georgette Dover. Follow-up survey of the patient confirms presence of the radioactive seed. Order number of I-125 seed:  [id number]. Total activity:  5.056 millicuries reference Date: 03/10/2022  The patient tolerated the procedure well and was [facility]. She was given instructions regarding seed removal. IMPRESSION: Radioactive seed localization left breast. No apparent complications. Electronically Signed   By: Franki Cabot M.D.   On: 03/31/2022 13:41    ASSESSMENT & PLAN:  1. Family history of cancer   2. Thrombocytopenia ([place])   3. 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 Labs reviewed and discussed with patient Platelet count is stable.  Slightly increased due to recent surgery.  Continue observation.  Vitamin B12 level has improved.  Recommend patient to decrease B12 supplementation to 3 times per week.  #Atypical ductal hyperplasia of left breast. 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  Recommend continuation of mammogram annually, discussed about chemoprevention with tamoxifen.  Patient would like to defer chemoprevention and continue active surveillance.  #Family history of cancer, refer to genetic counseling.  # Patient follow-up with me in 6 months  Orders Placed This Encounter  Procedures   CBC with Differential/Platelet    Standing Status:   Future    Standing Expiration Date:   04/20/2023   Comprehensive metabolic panel    Standing Status:   Future    Standing Expiration Date:   04/20/2023   Vitamin B12    Standing Status:   Future    Standing Expiration Date:   04/20/2023   Folate    Standing Status:   Future    Standing Expiration Date:   04/20/2023   Immature  Platelet Fraction    Standing Status:   Future    Standing Expiration Date:   04/20/2023   Lactate dehydrogenase    Standing Status:   Future    Standing Expiration Date:   04/20/2023   Ambulatory referral to Genetics    Referral Priority:   Routine    Referral Type:   Consultation    Referral Reason:   Specialty Services Required    Number of Visits Requested:   1    All questions were answered. The patient knows to call the clinic with any problems questions or concerns.  Cc Tysinger, Camelia Eng, PA-C  Follow-up in 12 months.  Earlie Server, MD, PhD 04/20/2022

## 2022-04-30 ENCOUNTER — Encounter (HOSPITAL_COMMUNITY): Payer: Self-pay

## 2022-05-12 ENCOUNTER — Inpatient Hospital Stay: Payer: 59

## 2022-05-12 ENCOUNTER — Encounter: Payer: Self-pay | Admitting: Licensed Clinical Social Worker

## 2022-05-12 ENCOUNTER — Inpatient Hospital Stay: Payer: 59 | Attending: Oncology | Admitting: Licensed Clinical Social Worker

## 2022-05-12 DIAGNOSIS — Z807 Family history of other malignant neoplasms of lymphoid, hematopoietic and related tissues: Secondary | ICD-10-CM

## 2022-05-12 DIAGNOSIS — Z8051 Family history of malignant neoplasm of kidney: Secondary | ICD-10-CM

## 2022-05-12 DIAGNOSIS — Z803 Family history of malignant neoplasm of breast: Secondary | ICD-10-CM

## 2022-05-12 NOTE — Progress Notes (Signed)
REFERRING PROVIDER: Earlie Server, MD [place],  [place] [address]  PRIMARY PROVIDER:  Carlena Hurl, PA-C  PRIMARY REASON FOR VISIT:  1. Family history of breast cancer   2. Family history of kidney cancer   3. Family history of non-Hodgkin's lymphoma      HISTORY OF PRESENT ILLNESS:   Susan Drake, a 40 y.o. female, was seen for a [facility] cancer genetics consultation at the request of Dr. Tasia Catchings due to a family history of cancer.  Susan Drake presents to clinic today to discuss the possibility of a hereditary predisposition to cancer, genetic testing, and to further clarify her future cancer risks, as well as potential cancer risks for family members.   Susan Drake is a 41 y.o. female with no personal history of cancer.    CANCER HISTORY:  Oncology History   No history exists.     RISK FACTORS:  Menarche was at age 63-11.  OCP use for approximately 10+ years.  Ovaries intact: yes.  Hysterectomy: no.  Menopausal status: premenopausal.  HRT use: 0 years. Colonoscopy: no Mammogram within the last year: yes. Number of breast biopsies: 1. - L ADH dx 40 Up to date with pelvic exams: yes.  Past Medical History:  Diagnosis Date   Anxiety    Constipation    sees Dr. Juanita Craver   Dermatological disease    sees Dr. Allyson Sabal, bilat upper arms and chest   Diverticulosis    Mild case   History of chlamydia    hx/o trichomonas and chlamydia in remote past   Liver cyst    Seasonal allergic rhinitis    Uterine fibroid     Past Surgical History:  Procedure Laterality Date   BREAST BIOPSY Left    BREAST LUMPECTOMY WITH RADIOACTIVE SEED LOCALIZATION Left 04/01/2022   Procedure: LEFT BREAST LUMPECTOMY WITH RADIOACTIVE SEED LOCALIZATION;  Surgeon: Donnie Mesa, MD;  Location: [facility];  Service: General;  Laterality: Left;   COLONOSCOPY     no prior colonoscopy   MYOMECTOMY  05/03/2012   Procedure: MYOMECTOMY;  Surgeon: Marvene Staff, MD;   Location: [facility];  Service: Gynecology;  Laterality: N/A;   Exploratory Laparotomy myomectomy. Open at 1410.   MYOMECTOMY  06/21/2019   Exploratory Laparotomy MYOMECTOMY (N/A Abdomen)   MYOMECTOMY N/A 06/21/2019   Procedure: Exploratory Laparotomy MYOMECTOMY;  Surgeon: Servando Salina, MD;  Location: [facility];  Service: Gynecology;  Laterality: N/A;   ROBOT ASSISTED MYOMECTOMY  05/03/2012   Procedure: ROBOTIC ASSISTED MYOMECTOMY;  Surgeon: Marvene Staff, MD;  Location: [facility];  Service: Gynecology;  Laterality: N/A;  Times two fibroids with robot.   WISDOM TOOTH EXTRACTION  2011   FAMILY HISTORY:  We obtained a detailed, 4-generation family history.  Significant diagnoses are listed below: Family History  Problem Relation Age of Onset   COPD Mother    Appendicitis Mother    Non-Hodgkin's lymphoma Mother 15   Aortic aneurysm Mother    COPD Father        emphysema   Hypertension Father    Eczema Father    Kidney disease Sister    Kidney cancer Sister 67   Cerebral palsy Sister    Fibroids Sister    Arthritis Brother    Cancer Maternal Uncle        liver   Breast cancer Paternal Aunt 22   Hypertension Cousin    Heart disease Neg Hx    Stroke Neg Hx  Diabetes Neg Hx    Susan Drake has 1 full brother, 1 maternal half sister, 1 paternal half sister. Her paternal half sister had kidney cancer at 14.   Susan Drake mother had non-Hogkin's lymphoma at 20 and is living at 72. A maternal uncle had liver cancer. No other known cancers on this side of the family.  Susan Drake father passed at 38. A paternal aunt had breast cancer at 2. No other known cancers on this side of the family.  Susan Drake is unaware of previous family history of genetic testing for hereditary cancer risks. There is no reported Ashkenazi Jewish ancestry. There is no known consanguinity.    GENETIC COUNSELING ASSESSMENT: Susan Drake is a 41 y.o. female with a family history which is not particularly suggestive  of a hereditary cancer syndrome and predisposition to cancer. We, therefore, discussed and recommended the following at today's visit.   DISCUSSION: We discussed that approximately 10% of cancer is hereditary. Most cases of hereditary breast cancer are associated with BRCA1/BRCA2 genes, although there are other genes associated with hereditary  cancer as well. Cancers and risks are gene specific. Her family history is not particularly concerning for hereditary cancer. While she does not meet criteria for testing, she could consider it if she would like to pay out of pocket. We discussed that testing is beneficial for several reasons including knowing about cancer risks, identifying potential screening and risk-reduction options that may be appropriate, and to understand if other family members could be at risk for cancer and allow them to undergo genetic testing.   We reviewed the characteristics, features and inheritance patterns of hereditary cancer syndromes. We also discussed genetic testing, including the appropriate family members to test, the process of testing, insurance coverage and turn-around-time for results. We discussed the implications of a negative, positive and/or variant of uncertain significant result. Susan Drake would like to pursue testing through the [facility] Common Hereditary Cancers+RNA panel.  Based on Susan Drake's family history of cancer, she does not meet medical criteria for testing. She will likely have an out of pocket cost of $250.   PLAN: After considering the risks, benefits, and limitations, Susan Drake provided informed consent to pursue genetic testing and the blood sample was sent to [facility] for analysis of the Common Hereditary Cancers+RNA panel. Results should be available within approximately 2-3 weeks' time, at which point they will be disclosed by telephone to Susan Drake, as will any additional recommendations warranted by these results. Susan Drake will  receive a summary of her genetic counseling visit and a copy of her results once available. This information will also be available in Epic.   Susan Drake questions were answered to her satisfaction today. Our contact information was provided should additional questions or concerns arise. Thank you for the referral and allowing [place] to share in the care of your patient.   Faith Rogue, MS, [facility] Genetic Counselor [email or web address] Phone: [phone]  The patient was seen for a total of 25 minutes in face-to-face genetic counseling.  Dr. Grayland Ormond was available for discussion regarding this case.   _______________________________________________________________________ For Office Staff:  Number of people involved in session: 1 Was an Intern/ student involved with case: no

## 2022-05-18 LAB — HM PAP SMEAR: HM Pap smear: NEGATIVE

## 2022-05-18 LAB — RESULTS CONSOLE HPV: CHL HPV: NEGATIVE

## 2022-05-26 ENCOUNTER — Ambulatory Visit: Payer: Self-pay | Admitting: Licensed Clinical Social Worker

## 2022-05-26 ENCOUNTER — Telehealth: Payer: Self-pay | Admitting: Licensed Clinical Social Worker

## 2022-05-26 ENCOUNTER — Encounter: Payer: Self-pay | Admitting: Licensed Clinical Social Worker

## 2022-05-26 DIAGNOSIS — Z1379 Encounter for other screening for genetic and chromosomal anomalies: Secondary | ICD-10-CM

## 2022-05-26 NOTE — Progress Notes (Signed)
HPI:  Susan Drake was previously seen in the [facility] clinic due to a family history of cancer and concerns regarding a hereditary predisposition to cancer. Please refer to our prior cancer genetics clinic note for more information regarding our discussion, assessment and recommendations, at the time. Susan Drake recent genetic test results were disclosed to her, as were recommendations warranted by these results. These results and recommendations are discussed in more detail below.  CANCER HISTORY:  Oncology History   No history exists.    FAMILY HISTORY:  We obtained a detailed, 4-generation family history.  Significant diagnoses are listed below: Family History  Problem Relation Age of Onset   COPD Mother    Appendicitis Mother    Non-Hodgkin's lymphoma Mother 61   Aortic aneurysm Mother    COPD Father        emphysema   Hypertension Father    Eczema Father    Kidney disease Sister    Kidney cancer Sister 4   Cerebral palsy Sister    Fibroids Sister    Arthritis Brother    Cancer Maternal Uncle        liver   Breast cancer Paternal Aunt 58   Hypertension Cousin    Heart disease Neg Hx    Stroke Neg Hx    Diabetes Neg Hx    Susan Drake has 1 full brother, 1 maternal half sister, 1 paternal half sister. Her paternal half sister had kidney cancer at 64.    Susan Drake mother had non-Hogkin's lymphoma at 72 and is living at 19. A maternal uncle had liver cancer. No other known cancers on this side of the family.   Susan Drake father passed at 67. A paternal aunt had breast cancer at 69. No other known cancers on this side of the family.   Susan Drake is unaware of previous family history of genetic testing for hereditary cancer risks. There is no reported Ashkenazi Jewish ancestry. There is no known consanguinity.     GENETIC TEST RESULTS: Genetic testing reported out on 05/26/2022 through the [facility] Common Hereditary Cancers+RNA cancer panel found no pathogenic  mutations.   The Common Hereditary Cancers Panel + RNA offered by [facility] includes sequencing and/or deletion duplication testing of the following 47 genes: APC, ATM, AXIN2, BARD1, BMPR1A, BRCA1, BRCA2, BRIP1, CDH1, CDKN2A (p14ARF), CDKN2A (p16INK4a), CKD4, CHEK2, CTNNA1, DICER1, EPCAM (Deletion/duplication testing only), GREM1 (promoter region deletion/duplication testing only), KIT, MEN1, MLH1, MSH2, MSH3, MSH6, MUTYH, NBN, NF1, NHTL1, PALB2, PDGFRA, PMS2, POLD1, POLE, PTEN, RAD50, RAD51C, RAD51D, SDHB, SDHC, SDHD, SMAD4, SMARCA4. STK11, TP53, TSC1, TSC2, and VHL.  The following genes were evaluated for sequence changes only: SDHA and HOXB13 c.251G>A variant only.   The test report has been scanned into EPIC and is located under the Molecular Pathology section of the Results Review tab.  A portion of the result report is included below for reference.     We discussed that because current genetic testing is not perfect, it is possible there may be a gene mutation in one of these genes that current testing cannot detect, but that chance is small.  There could be another gene that has not yet been discovered, or that we have not yet tested, that is responsible for the cancer diagnoses in the family. It is also possible there is a hereditary cause for the cancer in the family that Susan Drake did not inherit and therefore was not identified in her testing.  Therefore, it is  important to remain in touch with cancer genetics in the future so that we can continue to offer Susan Drake the most up to date genetic testing.   ADDITIONAL GENETIC TESTING: We discussed with Susan Drake that her genetic testing was fairly extensive.  If there are genes identified to increase cancer risk that can be analyzed in the future, we would be happy to discuss and coordinate this testing at that time.    CANCER SCREENING RECOMMENDATIONS: Susan Drake test result is considered negative (normal).  This means that we have not identified  a hereditary cause for her family history of cancer at this time.   While reassuring, this does not definitively rule out a hereditary predisposition to cancer. It is still possible that there could be genetic mutations that are undetectable by current technology. There could be genetic mutations in genes that have not been tested or identified to increase cancer risk.  Therefore, it is recommended she continue to follow the cancer management and screening guidelines provided by her primary healthcare provider.   An individual's cancer risk and medical management are not determined by genetic test results alone. Overall cancer risk assessment incorporates additional factors, including personal medical history, family history, and any available genetic information that may result in a personalized plan for cancer prevention and surveillance.  Based on Susan Drake's personal and family history of cancer as well as her genetic test results, risk model Harriett Rush was used to estimate her risk of developing breast cancer. This estimates her lifetime risk of developing breast cancer to be approximately 36%.  The patient's lifetime breast cancer risk is a preliminary estimate based on available information using one of several models endorsed by the [employer]  (NCCN). The NCCN recommends consideration of breast MRI screening as an adjunct to mammography for patients at high risk (defined as 20% or greater lifetime risk).  This risk estimate can change over time and may be repeated to reflect new information in her personal or family history in the future.  Susan Drake has been determined to be at high risk for breast cancer. her Tyrer-Cuzick risk score is 36%.  For women with a greater than 20% lifetime risk of breast cancer, the [employer]  (NCCN) recommends the following:   1.      Clinical encounter every 6-12 months to begin when identified as being at  increased risk, but not before age 90  2.      Annual mammograms. Tomosynthesis is recommended starting 10 years earlier than the youngest breast cancer diagnosis in the family or at age 36 (whichever comes first), but not before age 68    3.      Annual breast MRI starting 10 years earlier than the youngest breast cancer diagnosis in the family or at age 13 (whichever comes first), but not before age 75    [place]:  Relatives in this family might be at some increased risk of developing cancer, over the general population risk, simply due to the family history of cancer.  We recommended female relatives in this family have a yearly mammogram beginning at age 82, or 46 years younger than the earliest onset of cancer, an annual clinical breast exam, and perform monthly breast self-exams. Female relatives in this family should also have a gynecological exam as recommended by their primary provider.  All family members should be referred for colonoscopy starting at age 21.   FOLLOW-UP: Lastly, we discussed with  Susan Drake that cancer genetics is a rapidly advancing field and it is possible that new genetic tests will be appropriate for her and/or her family members in the future. We encouraged her to remain in contact with cancer genetics on an annual basis so we can update her personal and family histories and let her know of advances in cancer genetics that may benefit this family.   Our contact number was provided. Susan Drake questions were answered to her satisfaction, and she knows she is welcome to call us at anytime with additional questions or concerns.   Faith Rogue, MS, [facility] Genetic Counselor [email or web address] Phone: [phone]

## 2022-05-26 NOTE — Telephone Encounter (Signed)
Revealed negative genetic testing.  This normal result is reassuring.  It is unlikely that there is an increased risk of cancer due to a mutation in one of these genes.  However, genetic testing is not perfect, and cannot definitively rule out a hereditary cause.  It will be important for her to keep in contact with genetics to learn if any additional testing may be needed in the future.      

## 2022-06-17 DIAGNOSIS — L858 Other specified epidermal thickening: Secondary | ICD-10-CM | POA: Insufficient documentation

## 2022-07-07 ENCOUNTER — Encounter: Payer: Self-pay | Admitting: Internal Medicine

## 2022-08-23 ENCOUNTER — Encounter: Payer: Self-pay | Admitting: Medical

## 2022-08-23 ENCOUNTER — Ambulatory Visit (INDEPENDENT_AMBULATORY_CARE_PROVIDER_SITE_OTHER): Payer: 59 | Admitting: Medical

## 2022-08-23 VITALS — BP 110/70 | HR 67 | Ht 62.5 in | Wt 161.2 lb

## 2022-08-23 DIAGNOSIS — Z0000 Encounter for general adult medical examination without abnormal findings: Secondary | ICD-10-CM

## 2022-08-23 DIAGNOSIS — K5790 Diverticulosis of intestine, part unspecified, without perforation or abscess without bleeding: Secondary | ICD-10-CM

## 2022-08-23 DIAGNOSIS — Z13220 Encounter for screening for lipoid disorders: Secondary | ICD-10-CM

## 2022-08-23 DIAGNOSIS — L659 Nonscarring hair loss, unspecified: Secondary | ICD-10-CM

## 2022-08-23 DIAGNOSIS — D1803 Hemangioma of intra-abdominal structures: Secondary | ICD-10-CM

## 2022-08-23 DIAGNOSIS — E559 Vitamin D deficiency, unspecified: Secondary | ICD-10-CM

## 2022-08-23 DIAGNOSIS — D696 Thrombocytopenia, unspecified: Secondary | ICD-10-CM | POA: Diagnosis not present

## 2022-08-23 DIAGNOSIS — M7631 Iliotibial band syndrome, right leg: Secondary | ICD-10-CM | POA: Insufficient documentation

## 2022-08-23 DIAGNOSIS — K5909 Other constipation: Secondary | ICD-10-CM

## 2022-08-23 DIAGNOSIS — Z131 Encounter for screening for diabetes mellitus: Secondary | ICD-10-CM | POA: Insufficient documentation

## 2022-08-23 DIAGNOSIS — Z9889 Other specified postprocedural states: Secondary | ICD-10-CM

## 2022-08-23 DIAGNOSIS — E611 Iron deficiency: Secondary | ICD-10-CM

## 2022-08-23 DIAGNOSIS — Z862 Personal history of diseases of the blood and blood-forming organs and certain disorders involving the immune mechanism: Secondary | ICD-10-CM | POA: Insufficient documentation

## 2022-08-23 DIAGNOSIS — J309 Allergic rhinitis, unspecified: Secondary | ICD-10-CM

## 2022-08-23 DIAGNOSIS — Z7185 Encounter for immunization safety counseling: Secondary | ICD-10-CM

## 2022-08-23 HISTORY — DX: Encounter for screening for diabetes mellitus: Z13.1

## 2022-08-23 HISTORY — DX: Personal history of diseases of the blood and blood-forming organs and certain disorders involving the immune mechanism: Z86.2

## 2022-08-23 HISTORY — DX: Encounter for screening for lipoid disorders: Z13.220

## 2022-08-23 NOTE — Progress Notes (Signed)
Subjective:   HPI  Susan Drake is a 41 y.o. female who presents for Chief Complaint  Patient presents with   fasting cpe     Fasting cpe, leg hurting some and possible circulation issues,     Patient Care Team: Livy Ross, Camelia Eng, PA-C as PCP - General (Family Medicine) Donnie Mesa, MD as Consulting Physician (General Surgery) Earlie Server, MD as Consulting Physician (Oncology) Servando Salina, MD as Consulting Physician (Obstetrics and Gynecology) Rolene Course, PA-C as Physician Assistant (Internal Medicine) Loney Loh, MD (Dermatology) Faith Rogue T as Counselor (Genetic Counselor) Sees dentist Sees eye doctor  Concerns: Been having some ongoing hip issues.   Gets right hip pain down to knees, achy pain.  At end of September had swelling in right knee and pain in hip.  Trying to walk neighborhood, cant fully extend the leg back.   After fibroid surgery 2020, started having right hip pains.  Quit smoking a year ago in October  Reviewed their medical, surgical, family, social, medication, and allergy history and updated chart as appropriate.  Review of Systems  Constitutional:  Negative for chills, fever, malaise/fatigue and weight loss.  HENT:  Negative for congestion, ear pain, hearing loss, sore throat and tinnitus.   Eyes:  Negative for blurred vision, pain and redness.  Respiratory:  Negative for cough, hemoptysis and shortness of breath.   Cardiovascular:  Negative for chest pain, palpitations, orthopnea, claudication and leg swelling.  Gastrointestinal:  Negative for abdominal pain, blood in stool, constipation, diarrhea, nausea and vomiting.  Genitourinary:  Negative for dysuria, flank pain, frequency, hematuria and urgency.  Musculoskeletal:  Positive for joint pain. Negative for falls and myalgias.  Skin:  Negative for itching and rash.  Neurological:  Negative for dizziness, tingling, speech change, weakness and headaches.  Endo/Heme/Allergies:   Negative for polydipsia. Does not bruise/bleed easily.  Psychiatric/Behavioral:  Negative for depression and memory loss. The patient is not nervous/anxious and does not have insomnia.    Past Medical History:  Diagnosis Date   Anxiety    Constipation    sees Dr. Juanita Craver   Dermatological disease    sees Dr. Allyson Sabal, bilat upper arms and chest   Diverticulosis    Mild case   History of chlamydia    hx/o trichomonas and chlamydia in remote past   Liver cyst    Seasonal allergic rhinitis    Uterine fibroid     Family History  Problem Relation Age of Onset   COPD Mother    Appendicitis Mother    Non-Hodgkin's lymphoma Mother 39   Aortic aneurysm Mother    COPD Father        emphysema   Hypertension Father    Eczema Father    Kidney disease Sister    Kidney cancer Sister 7   Cerebral palsy Sister    Fibroids Sister    Arthritis Brother    Cancer Maternal Uncle        liver   Breast cancer Paternal Aunt 78   Hypertension Cousin    Heart disease Neg Hx    Stroke Neg Hx    Diabetes Neg Hx      Current Outpatient Medications:    Cholecalciferol 50 MCG (2000 UT) CAPS, Take by mouth., Disp: , Rfl:    fluticasone (FLONASE) 50 MCG/ACT nasal spray, SPRAY 2 SPRAYS INTO EACH NOSTRIL EVERY DAY, Disp: 16 mL, Rfl: 3   Probiotic Product (PROBIOTIC DAILY PO), Take 1 capsule by mouth daily. ,  Disp: , Rfl:    vitamin B-12 (CYANOCOBALAMIN) 1000 MCG tablet, Take 1,000 mcg by mouth daily., Disp: , Rfl:    Ciclopirox 1 % shampoo, Apply topically., Disp: , Rfl:   Allergies  Allergen Reactions   Other Hives    Dermabond         08/23/2022   10:00 AM 08/21/2021   10:41 AM 07/01/2021    3:01 PM 04/04/2020   11:48 AM  Depression screen PHQ 2/9  Decreased Interest 0 0 0 0  Down, Depressed, Hopeless 0 0 0 0  PHQ - 2 Score 0 0 0 0       Objective:  BP 110/70   Pulse 67   Ht 5' 2.5" (1.588 m)   Wt 161 lb 3.2 oz (73.1 kg)   BMI 29.01 kg/m   General appearance: alert, no  distress, WD/WN, African American female Skin:unremarkable HEENT: normocephalic, conjunctiva/corneas normal, sclerae anicteric, PERRLA, EOMi, nares patent, no discharge or erythema, pharynx normal Oral cavity: MMM, tongue normal, teeth normal Neck: supple, no lymphadenopathy, no thyromegaly, no masses, normal ROM, no bruits Chest: non tender, normal shape and expansion Heart: RRR, normal S1, S2, no murmurs Lungs: CTA bilaterally, no wheezes, rhonchi, or rales Abdomen: +bs, soft, non tender, non distended, no masses, no hepatomegaly, no splenomegaly, no bruits Back: non tender, normal ROM, no scoliosis Musculoskeletal:  tender right trochanter bursa and IT band on right, otherwise upper extremities non tender, no obvious deformity, normal ROM throughout, lower extremities non tender, no obvious deformity, normal ROM throughout Extremities: no edema, no cyanosis, no clubbing Pulses: 2+ symmetric, upper and lower extremities, normal cap refill Neurological: alert, oriented x 3, CN2-12 intact, strength normal upper extremities and lower extremities, sensation normal throughout, DTRs 2+ throughout, no cerebellar signs, gait normal Psychiatric: normal affect, behavior normal, pleasant  Breast/gyn/rectal - deferred to gynecology   Assessment and Plan :   Encounter Diagnoses  Name Primary?   Encounter for health maintenance examination in adult Yes   Thrombocytopenia ([place])    Chronic constipation    Diverticulosis    Allergic rhinitis, unspecified seasonality, unspecified trigger    Liver hemangioma    Vitamin D deficiency    Vaccine counseling    Tobacco abuse    S/P myomectomy    Iron deficiency    History of ITP    Screening for diabetes mellitus    Hair thinning    Screening for lipid disorders     This visit was a preventative care visit, also known as wellness visit or routine physical.   Topics typically include healthy lifestyle, diet, exercise, preventative care, vaccinations,  sick and well care, proper use of emergency dept and after hours care, as well as other concerns.     Recommendations: Continue to return yearly for your annual wellness and preventative care visits.  This gives [place] a chance to discuss healthy lifestyle, exercise, vaccinations, review your chart record, and perform screenings where appropriate.  I recommend you see your eye doctor yearly for routine vision care.  I recommend you see your dentist yearly for routine dental care including hygiene visits twice yearly.   Vaccination recommendations were reviewed Immunization History  Administered Date(s) Administered   Influenza,inj,Quad PF,6+ Mos 09/27/2018, 09/12/2019   Influenza-Unspecified 10/01/2021   PFIZER Comirnaty(Gray Top)Covid-19 Tri-Sucrose Vaccine 01/17/2020, 02/12/2020, 09/08/2020   Pfizer Covid-19 Vaccine Bivalent Booster 70yr & up 09/30/2021    She will get flu shot with her [facility] at [facility]  She thinks she is up  to date on Tdap and we will try and request records   Screening for cancer: Colon cancer screening: Age 40  Breast cancer screening: You should perform a self breast exam monthly.   Reviewed records from 2023 breast imaging and biopsy  Cervical cancer screening: We reviewed recommendations for pap smear screening.   Skin cancer screening: Check your skin regularly for new changes, growing lesions, or other lesions of concern Come in for evaluation if you have skin lesions of concern.  Lung cancer screening: If you have a greater than 20 pack year history of tobacco use, then you may qualify for lung cancer screening with a chest CT scan.   Please call your insurance company to inquire about coverage for this test.  We currently don't have screenings for other cancers besides breast, cervical, colon, and lung cancers.  If you have a strong family history of cancer or have other cancer screening concerns, please let me know.    Bone health: Get at  least 150 minutes of aerobic exercise weekly Get weight bearing exercise at least once weekly Bone density test:  A bone density test is an imaging test that uses a type of X-ray to measure the amount of calcium and other minerals in your bones. The test may be used to diagnose or screen you for a condition that causes weak or thin bones (osteoporosis), predict your risk for a broken bone (fracture), or determine how well your osteoporosis treatment is working. The bone density test is recommended for females 54 and older, or females or males <50 if certain risk factors such as thyroid disease, long term use of steroids such as for asthma or rheumatological issues, vitamin D deficiency, estrogen deficiency, family history of osteoporosis, self or family history of fragility fracture in first degree relative.    Heart health: Get at least 150 minutes of aerobic exercise weekly Limit alcohol It is important to maintain a healthy blood pressure and healthy cholesterol numbers  Heart disease screening: Screening for heart disease includes screening for blood pressure, fasting lipids, glucose/diabetes screening, BMI height to weight ratio, reviewed of smoking status, physical activity, and diet.    Goals include blood pressure 120/80 or less, maintaining a healthy lipid/cholesterol profile, preventing diabetes or keeping diabetes numbers under good control, not smoking or using tobacco products, exercising most days per week or at least 150 minutes per week of exercise, and eating healthy variety of fruits and vegetables, healthy oils, and avoiding unhealthy food choices like fried food, fast food, high sugar and high cholesterol foods.    Other tests may possibly include EKG test, CT coronary calcium score, echocardiogram, exercise treadmill stress test.    Medical care options: I recommend you continue to seek care here first for routine care.  We try really hard to have available appointments  Monday through Friday daytime hours for sick visits, acute visits, and physicals.  Urgent care should be used for after hours and weekends for significant issues that cannot wait till the next day.  The emergency department should be used for significant potentially life-threatening emergencies.  The emergency department is expensive, can often have long wait times for less significant concerns, so try to utilize primary care, urgent care, or telemedicine when possible to avoid unnecessary trips to the emergency department.  Virtual visits and telemedicine have been introduced since the pandemic started in 2020, and can be convenient ways to receive medical care.  We offer virtual appointments as well to assist you  in a variety of options to seek medical care.   Advanced Directives: I recommend you consider completing a [place] and Living Will.   These documents respect your wishes and help alleviate burdens on your loved ones if you were to become terminally ill or be in a position to need those documents enforced.    You can complete Advanced Directives yourself, have them notarized, then have copies made for our office, for you and for anybody you feel should have them in safe keeping.  Or, you can have an attorney prepare these documents.   If you haven't updated your Last Will and Testament in a while, it may be worthwhile having an attorney prepare these documents together and save on some costs.       Separate significant issues discussed: IT band tendnerss - gave some home stretches to work on .  If not much improved in the next 3-4 weeks, consider PT referral  Chronic constipation - no recent concerns  Hx/o vitamin D deficiency - updated labs today  Hair thinning - likely stress related.  Updated labs today  Hx/o iron deficiency - routine labs today  ITP - reviewed oncology notes eval from 2023  Hx/o breast biopsy - reviewed imaging and notes from eval 2023  including genetic screening  We will complete her biometric form for work pending lab results   Rossana was seen today for fasting cpe .  Diagnoses and all orders for this visit:  Encounter for health maintenance examination in adult -     Lipid panel -     Hemoglobin A1c -     TSH -     CBC -     Basic metabolic panel -     VITAMIN D 25 Hydroxy (Vit-D Deficiency, Fractures)  Thrombocytopenia ([facility])  Chronic constipation  Diverticulosis  Allergic rhinitis, unspecified seasonality, unspecified trigger  Liver hemangioma  Vitamin D deficiency -     VITAMIN D 25 Hydroxy (Vit-D Deficiency, Fractures)  Vaccine counseling  Tobacco abuse  S/P myomectomy  Iron deficiency -     CBC  History of ITP  Screening for diabetes mellitus -     Hemoglobin A1c  Hair thinning -     TSH -     CBC -     Basic metabolic panel  Screening for lipid disorders    Follow-up pending labs, yearly for physical

## 2022-08-23 NOTE — Patient Instructions (Signed)
Iliotibial Band Syndrome Rehab Ask your health care provider which exercises are safe for you. Do exercises exactly as told by your health care provider and adjust them as directed. It is normal to feel mild stretching, pulling, tightness, or discomfort as you do these exercises. Stop right away if you feel sudden pain or your pain gets significantly worse. Do not begin these exercises until told by your health care provider. Stretching and range-of-motion exercises These exercises warm up your muscles and joints and improve the movement and flexibility of your hip and pelvis. Quadriceps stretch, prone  Lie on your abdomen (prone position) on a firm surface, such as a bed or padded floor. Bend your left / right knee and reach back to hold your ankle or pant leg. If you cannot reach your ankle or pant leg, loop a belt around your foot and grab the belt instead. Gently pull your heel toward your buttocks. Your knee should not slide out to the side. You should feel a stretch in the front of your thigh and knee (quadriceps). Hold this position for __________ seconds. Repeat __________ times. Complete this exercise __________ times a day. Iliotibial band stretch An iliotibial band is a strong band of muscle tissue that runs from the outer side of your hip to the outer side of your thigh and knee. Lie on your side with your left / right leg in the top position. Bend both of your knees and grab your left / right ankle. Stretch out your bottom arm to help you balance. Slowly bring your top knee back so your thigh goes behind your trunk. Slowly lower your top leg toward the floor until you feel a gentle stretch on the outside of your left / right hip and thigh. If you do not feel a stretch and your knee will not fall farther, place the heel of your other foot on top of your knee and pull your knee down toward the floor with your foot. Hold this position for __________ seconds. Repeat __________ times.  Complete this exercise __________ times a day. Strengthening exercises These exercises build strength and endurance in your hip and pelvis. Endurance is the ability to use your muscles for a long time, even after they get tired. Straight leg raises, side-lying This exercise strengthens the muscles that rotate the leg at the hip and move it away from your body (hip abductors). Lie on your side with your left / right leg in the top position. Lie so your head, shoulder, hip, and knee line up. You may bend your bottom knee to help you balance. Roll your hips slightly forward so your hips are stacked directly over each other and your left / right knee is facing forward. Tense the muscles in your outer thigh and lift your top leg 4-6 inches (10-15 cm). Hold this position for __________ seconds. Slowly lower your leg to return to the starting position. Let your muscles relax completely before doing another repetition. Repeat __________ times. Complete this exercise __________ times a day. Leg raises, prone This exercise strengthens the muscles that move the hips backward (hip extensors). Lie on your abdomen (prone position) on your bed or a firm surface. You can put a pillow under your hips if that is more comfortable for your lower back. Bend your left / right knee so your foot is straight up in the air. Squeeze your buttocks muscles and lift your left / right thigh off the bed. Do not let your back arch. Tense   your thigh muscle as hard as you can without increasing any knee pain. Hold this position for __________ seconds. Slowly lower your leg to return to the starting position and allow it to relax completely. Repeat __________ times. Complete this exercise __________ times a day. Hip hike Stand sideways on a bottom step. Stand on your left / right leg with your other foot unsupported next to the step. You can hold on to a railing or wall for balance if needed. Keep your knees straight and your  torso square. Then lift your left / right hip up toward the ceiling. Slowly let your left / right hip lower toward the floor, past the starting position. Your foot should get closer to the floor. Do not lean or bend your knees. Repeat __________ times. Complete this exercise __________ times a day. This information is not intended to replace advice given to you by your health care provider. Make sure you discuss any questions you have with your health care provider. Document Revised: 12/26/2019 Document Reviewed: 12/26/2019 Elsevier Patient Education  2023 [employer]  

## 2022-08-24 ENCOUNTER — Other Ambulatory Visit: Payer: Self-pay | Admitting: Medical

## 2022-08-24 LAB — CBC
Hematocrit: 42.9 % (ref 34.0–46.6)
Hemoglobin: 13.3 g/dL (ref 11.1–15.9)
MCH: 25.5 pg — ABNORMAL LOW (ref 26.6–33.0)
MCHC: 31 g/dL — ABNORMAL LOW (ref 31.5–35.7)
MCV: 82 fL (ref 79–97)
Platelets: 161 10*3/uL (ref 150–450)
RBC: 5.21 x10E6/uL (ref 3.77–5.28)
RDW: 12.5 % (ref 11.7–15.4)
WBC: 5.5 10*3/uL (ref 3.4–10.8)

## 2022-08-24 LAB — LIPID PANEL
Chol/HDL Ratio: 2.7 ratio (ref 0.0–4.4)
Cholesterol, Total: 181 mg/dL (ref 100–199)
HDL: 66 mg/dL (ref >39)
LDL Chol Calc (NIH): 104 mg/dL — ABNORMAL HIGH (ref 0–99)
Triglycerides: 60 mg/dL (ref 0–149)
VLDL Cholesterol Cal: 11 mg/dL (ref 5–40)

## 2022-08-24 LAB — BASIC METABOLIC PANEL
BUN/Creatinine Ratio: 15 (ref 9–23)
BUN: 13 mg/dL (ref 6–24)
CO2: 18 mmol/L — ABNORMAL LOW (ref 20–29)
Calcium: 9.5 mg/dL (ref 8.7–10.2)
Chloride: 102 mmol/L (ref 96–106)
Creatinine, Ser: 0.87 mg/dL (ref 0.57–1.00)
Glucose: 83 mg/dL (ref 70–99)
Potassium: 4.1 mmol/L (ref 3.5–5.2)
Sodium: 138 mmol/L (ref 134–144)
eGFR: 85 mL/min/{1.73_m2} (ref >59)

## 2022-08-24 LAB — HEMOGLOBIN A1C
Est. average glucose Bld gHb Est-mCnc: 97 mg/dL
Hgb A1c MFr Bld: 5 % (ref 4.8–5.6)

## 2022-08-24 LAB — TSH: TSH: 1.92 u[IU]/mL (ref 0.450–4.500)

## 2022-08-24 LAB — VITAMIN D 25 HYDROXY (VIT D DEFICIENCY, FRACTURES): Vit D, 25-Hydroxy: 31.3 ng/mL (ref 30.0–100.0)

## 2022-08-24 MED ORDER — VITAMIN D (ERGOCALCIFEROL) 1.25 MG (50000 UNIT) PO CAPS: 50000.0000 [IU] | ORAL_CAPSULE | ORAL | 1 refills | Status: DC

## 2022-09-16 ENCOUNTER — Telehealth: Payer: Self-pay | Admitting: Medical

## 2022-09-16 NOTE — Telephone Encounter (Signed)
Received requested records from [facility]

## 2022-09-17 ENCOUNTER — Encounter: Payer: Self-pay | Admitting: Medical

## 2022-09-17 ENCOUNTER — Encounter: Payer: Self-pay | Admitting: Internal Medicine

## 2022-10-15 ENCOUNTER — Encounter: Payer: Self-pay | Admitting: Medical

## 2022-10-15 ENCOUNTER — Ambulatory Visit: Payer: 59 | Admitting: Medical

## 2022-10-15 VITALS — BP 118/72 | HR 78 | Temp 98.3°F | Wt 161.8 lb

## 2022-10-15 DIAGNOSIS — N898 Other specified noninflammatory disorders of vagina: Secondary | ICD-10-CM | POA: Diagnosis not present

## 2022-10-15 DIAGNOSIS — Z86018 Personal history of other benign neoplasm: Secondary | ICD-10-CM | POA: Diagnosis not present

## 2022-10-15 DIAGNOSIS — N920 Excessive and frequent menstruation with regular cycle: Secondary | ICD-10-CM | POA: Diagnosis not present

## 2022-10-15 DIAGNOSIS — R8290 Unspecified abnormal findings in urine: Secondary | ICD-10-CM

## 2022-10-15 LAB — POCT URINALYSIS DIP (CLINITEK)
Bilirubin, UA: NEGATIVE
Blood, UA: NEGATIVE
Glucose, UA: NEGATIVE mg/dL
Ketones, POC UA: NEGATIVE mg/dL
Leukocytes, UA: NEGATIVE
Nitrite, UA: NEGATIVE
POC PROTEIN,UA: NEGATIVE
Spec Grav, UA: 1.025 (ref 1.010–1.025)
Urobilinogen, UA: 0.2 E.U./dL
pH, UA: 6 (ref 5.0–8.0)

## 2022-10-15 NOTE — Progress Notes (Signed)
Subjective:  Susan Drake is a 41 y.o. female who presents for    Chief Complaint  Patient presents with   other    Possible UTI, looked like tissue floating in her urine, looks like internal tissue, light pink brownish orange color discharge spots on her sheets,    Here for urinary concern.   Recently saw some particles floating in urine, looked like tissue/internal tissue floating.  Since seeing discharge after fibroid surgery, has had this "tissue" and particles from time to time.    Her menstrual cycles sometimes irregular.  She does get some discharge what she describes as to she did not like it..  This happens somewhat regularly.  She does note sometimes has discomfort with intercourse and sometimes bleeding after intercourse.  She does have a history of fibroids and history of thyroid surgery.  She denies any yellowish or green or odorous discharge.  No concern for STD.  She is married.  She is not currently concerned about pregnancy.  She is not on birth control and has seen a fertility specialist in the past year.  She has had this tissue discharge concern for over a year or more  Last UTI never.  Last vaginal infection BV 2021.  Pap smear in May 2023 normal  Although she has had this tissue discharge concern she is only ever seen particles in her urine twice including this past week  Sometimes strong urine. No frequency, no urgency.  No vagina discharge,   LMP 09/27/22 ended 12/2   No other aggravating or relieving factors.    No other c/o.  Past Medical History:  Diagnosis Date   Anxiety    Constipation    sees Dr. Juanita Craver   Dermatological disease    sees Dr. Allyson Sabal, bilat upper arms and chest   Diverticulosis    Mild case   Former smoker    quit 08/2021   History of chlamydia    hx/o trichomonas and chlamydia in remote past   Liver cyst    Seasonal allergic rhinitis    Uterine fibroid    Current Outpatient Medications on File Prior to Visit  Medication Sig  Dispense Refill   Ciclopirox 1 % shampoo Apply topically.     fluticasone (FLONASE) 50 MCG/ACT nasal spray SPRAY 2 SPRAYS INTO EACH NOSTRIL EVERY DAY 16 mL 3   Probiotic Product (PROBIOTIC DAILY PO) Take 1 capsule by mouth daily.      vitamin B-12 (CYANOCOBALAMIN) 1000 MCG tablet Take 1,000 mcg by mouth daily.     Vitamin D, Ergocalciferol, (DRISDOL) 1.25 MG (50000 UNIT) CAPS capsule Take 1 capsule (50,000 Units total) by mouth every 7 (seven) days. 12 capsule 1   No current facility-administered medications on file prior to visit.     The following portions of the patient's history were reviewed and updated as appropriate: allergies, current medications, past family history, past medical history, past social history, past surgical history and problem list.  ROS Otherwise as in subjective above  Objective: BP 118/72   Pulse 78   Temp 98.3 F (36.8 C)   Wt 161 lb 12.8 oz (73.4 kg)   BMI 29.12 kg/m   General appearance: alert, no distress, well developed, well nourished Abdomen: +bs, soft, non tender, non distended, no masses, no hepatomegaly, no splenomegaly Pulses: 2+ radial pulses, 2+ pedal pulses, normal cap refill Ext: no edema Gyn - deferred/declined   Assessment: Encounter Diagnoses  Name Primary?   Spotting Yes   Urine  finding    Vaginal discharge    History of uterine fibroid      Plan: We discussed her concerns.  Urine culture sent, urinalysis unremarkable.  I suspect her symptoms are related more to vaginal discharge and not a urinary issue.  She will follow-up with gynecology.  She had a recent pelvic ultrasound but she never [place] back on the results.  We discussed that she may ultimately need an endometrial biopsy or other evaluation.  She will go ahead and make a follow-up with gynecology  She declines other labs today  Sweta was seen today for other.  Diagnoses and all orders for this visit:  Spotting -     POCT URINALYSIS DIP (CLINITEK) -      Cancel: POCT URINALYSIS DIP (CLINITEK) -     Urine Culture  Urine finding  Vaginal discharge  History of uterine fibroid    Follow up: with gynecology

## 2022-10-18 NOTE — Progress Notes (Signed)
Results sent through MyChart

## 2022-10-19 ENCOUNTER — Other Ambulatory Visit: Payer: Self-pay | Admitting: Medical

## 2022-10-19 LAB — URINE CULTURE

## 2022-10-19 MED ORDER — SULFAMETHOXAZOLE-TRIMETHOPRIM 800-160 MG PO TABS: 1.0000 | ORAL_TABLET | Freq: Two times a day (BID) | ORAL | 0 refills | Status: DC

## 2022-11-01 ENCOUNTER — Other Ambulatory Visit: Payer: Self-pay | Admitting: Medical

## 2022-11-02 NOTE — Telephone Encounter (Signed)
Refill request last apt 10/15/22 next apt 08/25/23.

## 2022-11-03 ENCOUNTER — Telehealth: Payer: Self-pay | Admitting: Medical

## 2022-11-03 NOTE — Telephone Encounter (Signed)
Susan Drake wants to know if she can take tylenol with her antibiotic because she is in pain?!

## 2022-11-30 ENCOUNTER — Other Ambulatory Visit: Payer: Self-pay | Admitting: Medical

## 2022-12-02 HISTORY — PX: COLONOSCOPY: SHX174

## 2023-01-25 ENCOUNTER — Encounter: Payer: Self-pay | Admitting: Family Medicine

## 2023-01-25 ENCOUNTER — Ambulatory Visit (INDEPENDENT_AMBULATORY_CARE_PROVIDER_SITE_OTHER): Payer: 59 | Admitting: Family Medicine

## 2023-01-25 VITALS — BP 130/80 | HR 80 | Temp 98.4°F | Ht 62.5 in | Wt 160.0 lb

## 2023-01-25 DIAGNOSIS — R221 Localized swelling, mass and lump, neck: Secondary | ICD-10-CM

## 2023-01-25 NOTE — Patient Instructions (Signed)
  Leave the area alone.  If it starts to become painful or larger, resume the warm compresses. Return for re-evaluation if significant increase in size, pain, fever, redness or other concerns.

## 2023-01-25 NOTE — Progress Notes (Signed)
Chief Complaint  Patient presents with   Mass    Saturday mass popped up behind her left ear, she has been doing warm compresses all weekend. Does not hurt just wants to know what it is.    Woke up 3 days ago with a firm knot behind her left ear.  It wasn't painful, just noticed in the shower. She tried warm compresses.  It might be a bit smaller, but still feels firm.  She took an antibiotic for a week, starting 3/11 for an infection on her 2 upper back teeth on the left. 3/19 she got the root canal and crown finished. Didn't notice the bump behind the left ear until 3/23.  She denies any fever, chills. Woke up today with L eye watering, and runny nose.  Not needing to blow her nose; symptoms improved after using Flonase.  She uses this seasonally, but hasn't used it in a while.  Denies any scalp concerns, no rash, bumps.  She has a spot of hair loss on the right side, that she has seen dermatologist for in the past.  No cat scratches.  PMH, PSH, SH reviewed  Outpatient Encounter Medications as of 01/25/2023  Medication Sig Note   fluticasone (FLONASE) 50 MCG/ACT nasal spray SPRAY 2 SPRAYS INTO EACH NOSTRIL EVERY DAY    Probiotic Product (PROBIOTIC DAILY PO) Take 1 capsule by mouth daily.     vitamin B-12 (CYANOCOBALAMIN) 1000 MCG tablet Take 1,000 mcg by mouth daily.    Vitamin D, Ergocalciferol, (DRISDOL) 1.25 MG (50000 UNIT) CAPS capsule Take 1 capsule (50,000 Units total) by mouth every 7 (seven) days.    Cholecalciferol (VITAMIN D3) 50 MCG (2000 UT) capsule TAKE 1 CAPSULE BY MOUTH EVERY DAY (Patient not taking: Reported on 01/25/2023)    Ciclopirox 1 % shampoo Apply topically. (Patient not taking: Reported on 01/25/2023) 01/25/2023: As needed   [DISCONTINUED] sulfamethoxazole-trimethoprim (BACTRIM DS) 800-160 MG tablet Take 1 tablet by mouth 2 (two) times daily.    No facility-administered encounter medications on file as of 01/25/2023.   Also has Allegra at home, hasn't been  taking.  Allergies  Allergen Reactions   Other Hives    Dermabond    ROS: no fever, chills, URI symptoms. Mild allergy symptoms this morning, improved after using Flonase. No headaches, dizziness, chest pain, shortness of breath, n/v/d or other concerns.   PHYSICAL EXAM:  BP 130/80   Pulse 80   Temp 98.4 F (36.9 C) (Tympanic)   Ht 5' 2.5" (1.588 m)   Wt 160 lb (72.6 kg)   LMP 12/18/2022 (Exact Date)   BMI 28.80 kg/m   Well-appearing, pleasant female, in no distress HEENT: conjunctiva and sclera are clear, EOMI. TM's and EAC's normal. OP clear, without any erythema or swelling near recent dental work. 5-24mm flesh-colored soft tissue mass overlying the upper portion of the mastoid bone behind the L ear. No erythema, no fluctuance, not indurated, not tender.  Skin appears normal, no visible pore. Slightly mobile and separate from the underlying bone (and softer than the bone). Neck: no lymphadenopathy or mass, no thyromegaly  ASSESSMENT/PLAN:  Lump on neck - soft tissue, overlying mastoid bone.  Ddx reviewed. Doesn't appear to be EIC. Warm compresses if growing/tender and f/u if worse  Soft tissue mass vs cyst overlying mastoid bone behind the L ear.  Nothing to suggest any infection, cancer, or other concern. Continued observation. Patient reassurred   Leave the area alone.  If it starts to become painful or  larger, resume the warm compresses. Return for re-evaluation if significant increase in size, pain, fever, redness or other concerns.

## 2023-02-11 ENCOUNTER — Encounter: Payer: Self-pay | Admitting: Medical

## 2023-02-11 ENCOUNTER — Ambulatory Visit (INDEPENDENT_AMBULATORY_CARE_PROVIDER_SITE_OTHER): Payer: 59 | Admitting: Medical

## 2023-02-11 VITALS — BP 130/80 | HR 72 | Wt 158.4 lb

## 2023-02-11 DIAGNOSIS — R29898 Other symptoms and signs involving the musculoskeletal system: Secondary | ICD-10-CM

## 2023-02-11 DIAGNOSIS — M79602 Pain in left arm: Secondary | ICD-10-CM

## 2023-02-11 DIAGNOSIS — M545 Low back pain, unspecified: Secondary | ICD-10-CM | POA: Diagnosis not present

## 2023-02-11 DIAGNOSIS — M25551 Pain in right hip: Secondary | ICD-10-CM

## 2023-02-11 DIAGNOSIS — R200 Anesthesia of skin: Secondary | ICD-10-CM | POA: Diagnosis not present

## 2023-02-11 DIAGNOSIS — R82998 Other abnormal findings in urine: Secondary | ICD-10-CM | POA: Diagnosis not present

## 2023-02-11 DIAGNOSIS — M542 Cervicalgia: Secondary | ICD-10-CM

## 2023-02-11 LAB — POCT URINALYSIS DIP (PROADVANTAGE DEVICE)
Bilirubin, UA: NEGATIVE
Blood, UA: NEGATIVE
Glucose, UA: NEGATIVE mg/dL
Ketones, POC UA: NEGATIVE mg/dL
Leukocytes, UA: NEGATIVE
Nitrite, UA: NEGATIVE
Protein Ur, POC: NEGATIVE mg/dL
Specific Gravity, Urine: 1.01
Urobilinogen, Ur: NEGATIVE
pH, UA: 6 (ref 5.0–8.0)

## 2023-02-11 MED ORDER — IBUPROFEN 600 MG PO TABS: 600.0000 mg | ORAL_TABLET | Freq: Two times a day (BID) | ORAL | 0 refills | Status: DC | PRN

## 2023-02-11 MED ORDER — HYDROCODONE-ACETAMINOPHEN 5-325 MG PO TABS
1.0000 | ORAL_TABLET | Freq: Four times a day (QID) | ORAL | 0 refills | Status: DC | PRN
Start: 2023-02-11 — End: 2023-04-22

## 2023-02-11 NOTE — Progress Notes (Signed)
Subjective:  Susan Drake is a 42 y.o. female who presents for Chief Complaint  Patient presents with   urine and back pain    Pt complains of mid to lower back pain. (Dull and deep) Pt complains of numbness throughout left arm and feels really heavy and throbbing (started in January) She wants to know if she can have a referral to a breast center. Lasts all day long.   Pt states that after she urinates she begins to feel pain in her lower back (achy)      Here for several concerns.  Lately she has been having some left arm pain, left arm feels weak at times, feels some numbness and tingling throughout the arm.  No recent injury or trauma or fall.  Some mild neck pain worse in the arm.  No strenuous activity recently.  No prior neck injury.  She has some ongoing low back pain for the last several days.  No injury trauma or fall.  No strenuous activity.  She also has pain that radiates to her buttock and right leg.  No numbness or weakness or tingling in the right leg.  No left leg concerns.  She also has some tenderness in the left chest wall in the armpit region but no breast concern.  She has a history of dense breast.  She does do self breast exams.  In the last few days along with the back pain she is felt some foamy urine, and will get pulsations in her back after urinating.  No odor or blood in urine, no frequency or urgency though.  History of constipation but lately it has been okay.  She just also finished her menstrual period  Hasn't been able to exercise regularly due to pains in right side and leg.  Feels like her leg cannot handle much more than 20 minutes of exercise.  She did do physical therapy 2 years ago for pelvic floor pain.  no other aggravating or relieving factors.    No other c/o.  Past Medical History:  Diagnosis Date   Anxiety    Constipation    sees Dr. Charna Elizabeth   Dermatological disease    sees Dr. Terri Piedra, bilat upper arms and chest   Diverticulosis     Mild case   Former smoker    quit 08/2021   History of chlamydia    hx/o trichomonas and chlamydia in remote past   Liver cyst    Seasonal allergic rhinitis    Uterine fibroid    Current Outpatient Medications on File Prior to Visit  Medication Sig Dispense Refill   Ciclopirox 1 % shampoo Apply topically.     Probiotic Product (PROBIOTIC DAILY PO) Take 1 capsule by mouth daily.      vitamin B-12 (CYANOCOBALAMIN) 1000 MCG tablet Take 1,000 mcg by mouth daily.     Vitamin D, Ergocalciferol, (DRISDOL) 1.25 MG (50000 UNIT) CAPS capsule Take 1 capsule (50,000 Units total) by mouth every 7 (seven) days. 12 capsule 1   Cholecalciferol (VITAMIN D3) 50 MCG (2000 UT) capsule TAKE 1 CAPSULE BY MOUTH EVERY DAY (Patient not taking: Reported on 02/11/2023) 90 capsule 1   fluticasone (FLONASE) 50 MCG/ACT nasal spray SPRAY 2 SPRAYS INTO EACH NOSTRIL EVERY DAY (Patient not taking: Reported on 02/11/2023) 16 mL 3   No current facility-administered medications on file prior to visit.     The following portions of the patient's history were reviewed and updated as appropriate: allergies, current medications, past  family history, past medical history, past social history, past surgical history and problem list.  ROS Otherwise as in subjective above  Objective: BP 130/80   Pulse 72   Wt 158 lb 6.4 oz (71.8 kg)   LMP 02/03/2023   SpO2 99%   BMI 28.51 kg/m   General appearance: alert, no distress, well developed, well nourished Neck: supple, no lymphadenopathy, no thyromegaly, no masses, normal range of motion but she does seem to have some pain with range of motion in general Heart: RRR, normal S1, S2, no murmurs Lungs: CTA bilaterally, no wheezes, rhonchi, or rales Abdomen: +bs, soft, non tender, non distended, no masses, no hepatomegaly, no splenomegaly Tender over the lower back lumbar spine region paraspinally but not midline, range of motion relatively full but she does note some pain with range  of motion MSK: Tender over left arm in general from elbow to the shoulder but no specific joint tenderness, range of motion school, tenderness seems to be a little out of proportion to the exam.  Tender over the right hip, right trochanter bursa, right buttock and right lateral leg, range of motion normal and rest of leg nontender without swelling or deformity, rest of extremities unremarkable Pulses: 2+ radial pulses, 2+ pedal pulses, normal cap refill Ext: no edema    Assessment: Encounter Diagnoses  Name Primary?   Bilateral low back pain without sciatica, unspecified chronicity Yes   Left arm pain    Arm weakness    Left arm numbness    Foamy urine    Right hip pain    Neck pain      Plan: We discussed her pains and concerns.  Left arm pain and numbness and weakness-we discussed possible causes but suspect inflammation in the cervical spine or other.  Labs today just to rule out other electrolyte imbalance.  Will likely plan for C-spine x-ray  Low back pain, right leg pain, radicular pain but also signs of myalgia-labs today, consider lumbar spine x-rays.  In addition to her back and buttock pain she was tender over the trochanter bursa as well.  Urinalysis unremarkable  Can use over-the-counter analgesics or medications below.  Hydrocodone as needed for worse pain, anti-inflammatory the next few days   Jhania was seen today for urine and back pain.  Diagnoses and all orders for this visit:  Bilateral low back pain without sciatica, unspecified chronicity -     POCT Urinalysis DIP (Proadvantage Device) -     DG Lumbar Spine Complete; Future  Left arm pain -     CBC with Differential/Platelet -     Sedimentation rate -     DG Cervical Spine Complete; Future  Arm weakness -     CBC with Differential/Platelet -     Sedimentation rate -     DG Cervical Spine Complete; Future  Left arm numbness -     CBC with Differential/Platelet -     Sedimentation rate -     DG  Cervical Spine Complete; Future  Foamy urine -     POCT Urinalysis DIP (Proadvantage Device)  Right hip pain  Neck pain -     DG Cervical Spine Complete; Future  Other orders -     HYDROcodone-acetaminophen (NORCO) 5-325 MG tablet; Take 1 tablet by mouth every 6 (six) hours as needed. -     ibuprofen (ADVIL) 600 MG tablet; Take 1 tablet (600 mg total) by mouth 2 (two) times daily as needed.    Follow  up: pending labs

## 2023-02-12 LAB — CBC WITH DIFFERENTIAL/PLATELET
Basophils Absolute: 0 10*3/uL (ref 0.0–0.2)
Basos: 0 %
EOS (ABSOLUTE): 0 10*3/uL (ref 0.0–0.4)
Eos: 1 %
Hematocrit: 43.5 % (ref 34.0–46.6)
Hemoglobin: 13.8 g/dL (ref 11.1–15.9)
Immature Grans (Abs): 0 10*3/uL (ref 0.0–0.1)
Immature Granulocytes: 0 %
Lymphocytes Absolute: 1.9 10*3/uL (ref 0.7–3.1)
Lymphs: 37 %
MCH: 26.2 pg — ABNORMAL LOW (ref 26.6–33.0)
MCHC: 31.7 g/dL (ref 31.5–35.7)
MCV: 83 fL (ref 79–97)
Monocytes Absolute: 0.4 10*3/uL (ref 0.1–0.9)
Monocytes: 8 %
Neutrophils Absolute: 2.7 10*3/uL (ref 1.4–7.0)
Neutrophils: 54 %
Platelets: 184 10*3/uL (ref 150–450)
RBC: 5.27 x10E6/uL (ref 3.77–5.28)
RDW: 12.7 % (ref 11.7–15.4)
WBC: 5.1 10*3/uL (ref 3.4–10.8)

## 2023-02-12 LAB — SEDIMENTATION RATE: Sed Rate: 9 mm/hr (ref 0–32)

## 2023-02-13 NOTE — Progress Notes (Signed)
Results sent through MyChart

## 2023-02-14 ENCOUNTER — Other Ambulatory Visit: Payer: Self-pay | Admitting: Obstetrics and Gynecology

## 2023-02-14 ENCOUNTER — Ambulatory Visit
Admission: RE | Admit: 2023-02-14 | Discharge: 2023-02-14 | Disposition: A | Discharge status: Home / Self Care | Payer: 59 | Source: Ambulatory Visit | Attending: Medical | Admitting: Medical

## 2023-02-14 DIAGNOSIS — R200 Anesthesia of skin: Secondary | ICD-10-CM

## 2023-02-14 DIAGNOSIS — M79602 Pain in left arm: Secondary | ICD-10-CM

## 2023-02-14 DIAGNOSIS — Z1231 Encounter for screening mammogram for malignant neoplasm of breast: Secondary | ICD-10-CM

## 2023-02-14 DIAGNOSIS — R29898 Other symptoms and signs involving the musculoskeletal system: Secondary | ICD-10-CM

## 2023-02-14 DIAGNOSIS — M542 Cervicalgia: Secondary | ICD-10-CM

## 2023-02-14 DIAGNOSIS — M545 Low back pain, unspecified: Secondary | ICD-10-CM

## 2023-02-16 NOTE — Progress Notes (Signed)
The back x-ray was normal but did neck x-ray shows some degenerative changes  How are symptoms in the last few days?  Anything different from the last visit in terms of symptoms?

## 2023-02-21 ENCOUNTER — Other Ambulatory Visit: Payer: Self-pay | Admitting: Internal Medicine

## 2023-02-21 DIAGNOSIS — M545 Low back pain, unspecified: Secondary | ICD-10-CM

## 2023-03-07 LAB — HM MAMMOGRAPHY

## 2023-03-08 ENCOUNTER — Encounter: Payer: Self-pay | Admitting: *Deleted

## 2023-03-18 ENCOUNTER — Encounter: Payer: Self-pay | Admitting: Oncology

## 2023-04-20 ENCOUNTER — Ambulatory Visit: Payer: 59 | Admitting: Oncology

## 2023-04-20 ENCOUNTER — Inpatient Hospital Stay: Payer: 59 | Attending: Oncology

## 2023-04-20 DIAGNOSIS — Z806 Family history of leukemia: Secondary | ICD-10-CM | POA: Diagnosis not present

## 2023-04-20 DIAGNOSIS — Z8051 Family history of malignant neoplasm of kidney: Secondary | ICD-10-CM | POA: Insufficient documentation

## 2023-04-20 DIAGNOSIS — Z803 Family history of malignant neoplasm of breast: Secondary | ICD-10-CM | POA: Insufficient documentation

## 2023-04-20 DIAGNOSIS — Z87891 Personal history of nicotine dependence: Secondary | ICD-10-CM | POA: Insufficient documentation

## 2023-04-20 DIAGNOSIS — N6092 Unspecified benign mammary dysplasia of left breast: Secondary | ICD-10-CM | POA: Diagnosis not present

## 2023-04-20 DIAGNOSIS — D696 Thrombocytopenia, unspecified: Secondary | ICD-10-CM | POA: Diagnosis present

## 2023-04-20 DIAGNOSIS — Z808 Family history of malignant neoplasm of other organs or systems: Secondary | ICD-10-CM | POA: Diagnosis not present

## 2023-04-20 LAB — COMPREHENSIVE METABOLIC PANEL
ALT: 19 U/L (ref 0–44)
AST: 20 U/L (ref 15–41)
Albumin: 4.4 g/dL (ref 3.5–5.0)
Alkaline Phosphatase: 31 U/L — ABNORMAL LOW (ref 38–126)
Anion gap: 10 (ref 5–15)
BUN: 18 mg/dL (ref 6–20)
CO2: 24 mmol/L (ref 22–32)
Calcium: 8.8 mg/dL — ABNORMAL LOW (ref 8.9–10.3)
Chloride: 100 mmol/L (ref 98–111)
Creatinine, Ser: 0.97 mg/dL (ref 0.44–1.00)
GFR, Estimated: >60 mL/min (ref >60)
Glucose, Bld: 85 mg/dL (ref 70–99)
Potassium: 4 mmol/L (ref 3.5–5.1)
Sodium: 134 mmol/L — ABNORMAL LOW (ref 135–145)
Total Bilirubin: 0.8 mg/dL (ref 0.3–1.2)
Total Protein: 7.5 g/dL (ref 6.5–8.1)

## 2023-04-20 LAB — CBC WITH DIFFERENTIAL/PLATELET
Abs Immature Granulocytes: 0.02 10*3/uL (ref 0.00–0.07)
Basophils Absolute: 0 10*3/uL (ref 0.0–0.1)
Basophils Relative: 0 %
Eosinophils Absolute: 0 10*3/uL (ref 0.0–0.5)
Eosinophils Relative: 0 %
HCT: 41.3 % (ref 36.0–46.0)
Hemoglobin: 13.1 g/dL (ref 12.0–15.0)
Immature Granulocytes: 0 %
Lymphocytes Relative: 23 %
Lymphs Abs: 1.8 10*3/uL (ref 0.7–4.0)
MCH: 26.3 pg (ref 26.0–34.0)
MCHC: 31.7 g/dL (ref 30.0–36.0)
MCV: 82.9 fL (ref 80.0–100.0)
Monocytes Absolute: 0.7 10*3/uL (ref 0.1–1.0)
Monocytes Relative: 9 %
Neutro Abs: 5 10*3/uL (ref 1.7–7.7)
Neutrophils Relative %: 68 %
Platelets: 162 10*3/uL (ref 150–400)
RBC: 4.98 MIL/uL (ref 3.87–5.11)
RDW: 12.9 % (ref 11.5–15.5)
Smear Review: NORMAL
WBC: 7.6 10*3/uL (ref 4.0–10.5)
nRBC: 0 % (ref 0.0–0.2)

## 2023-04-20 LAB — VITAMIN B12: Vitamin B-12: 564 pg/mL (ref 180–914)

## 2023-04-20 LAB — IMMATURE PLATELET FRACTION: Immature Platelet Fraction: 15.1 % — ABNORMAL HIGH (ref 1.2–8.6)

## 2023-04-20 LAB — FOLATE: Folate: 14.8 ng/mL (ref >5.9)

## 2023-04-20 LAB — LACTATE DEHYDROGENASE: LDH: 126 U/L (ref 98–192)

## 2023-04-22 ENCOUNTER — Encounter: Payer: Self-pay | Admitting: Oncology

## 2023-04-22 ENCOUNTER — Inpatient Hospital Stay (HOSPITAL_BASED_OUTPATIENT_CLINIC_OR_DEPARTMENT_OTHER): Payer: 59 | Admitting: Oncology

## 2023-04-22 VITALS — BP 135/96 | HR 105 | Temp 97.8°F | Ht 62.5 in | Wt 160.3 lb

## 2023-04-22 DIAGNOSIS — D696 Thrombocytopenia, unspecified: Secondary | ICD-10-CM

## 2023-04-22 DIAGNOSIS — Z809 Family history of malignant neoplasm, unspecified: Secondary | ICD-10-CM | POA: Diagnosis not present

## 2023-04-22 DIAGNOSIS — Z803 Family history of malignant neoplasm of breast: Secondary | ICD-10-CM

## 2023-04-22 DIAGNOSIS — N6092 Unspecified benign mammary dysplasia of left breast: Secondary | ICD-10-CM | POA: Diagnosis not present

## 2023-04-22 NOTE — Progress Notes (Signed)
She does want you to know she has a bruise, was made by bumping into her bed a week ago.

## 2023-04-22 NOTE — Assessment & Plan Note (Signed)
History of ITP Labs reviewed and discussed with patient Platelet count is stable. Vitamin B12 level has improved.   Recommend patient to continue  B12 supplementation to 2-3 times per week.

## 2023-04-22 NOTE — Assessment & Plan Note (Addendum)
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  Recommend continuation of mammogram annually, discussed about chemoprevention with tamoxifen.  Patient declined chemoprevention and continue active surveillance. Recent mammogram 03/07/23 negative.

## 2023-04-22 NOTE — Assessment & Plan Note (Signed)
Genetic testing showed no pathological mutation.

## 2023-04-22 NOTE — Progress Notes (Signed)
Hematology/Oncology Progress note [phone] [phone]     CHIEF COMPLAINTS/REASON FOR VISIT:  Thrombocytopenia  ASSESSMENT & PLAN:   Thrombocytopenia (HCC) History of ITP Labs reviewed and discussed with patient Platelet count is stable. Vitamin B12 level has improved.   Recommend patient to continue  B12 supplementation to 2-3 times per week.   Atypical ductal hyperplasia of left breast Discussed with patient that ADH is a high risk precancer lesion.  Recommend continuation of mammogram annually, discussed about chemoprevention with tamoxifen.  Patient declined chemoprevention and continue active surveillance. Recent mammogram 03/07/23 negative.   Family history of cancer Genetic testing showed no pathological mutation.   Orders Placed This Encounter  Procedures   CMP (Cancer Center only)    Standing Status:   Future    Standing Expiration Date:   04/21/2024   CBC with Differential (Cancer Center Only)    Standing Status:   Future    Standing Expiration Date:   04/21/2024   Vitamin B12    Standing Status:   Future    Standing Expiration Date:   04/21/2024   Folate    Standing Status:   Future    Standing Expiration Date:   04/21/2024   Lactate dehydrogenase    Standing Status:   Future    Standing Expiration Date:   04/21/2024   Immature Platelet Fraction    Standing Status:   Future    Standing Expiration Date:   04/21/2024   Follow up in 1 year All questions were answered. The patient knows to call the clinic with any problems, questions or concerns.  Rickard Patience, MD, PhD [facility] Hematology Oncology 04/22/2023    HISTORY OF PRESENTING ILLNESS:  Susan Drake is a 42 y.o. female who was seen in consultation at the request of Tysinger, Kermit Balo, PA-C for evaluation of thrombocytopenia   Reviewed patient's labs done previously.  08/21/2021 labs showed decreased platelet counts at 136,000. Normal wbc normal hemoglobin  Reviewed patient's  previous labs. Thrombocytopenia is intermittent, chronic onset , since at least 2013 No aggravating or elevated factors.  Associated symptoms or signs:  Denies weight loss, fever, chills, fatigue, night sweats.  Denies hematochezia, hematuria, hematemesis, epistaxis, black tarry stool. + easy bruising on her bilateral lower extremities..   Patient drinks alcohol very occasionally. Patient denies any bleeding complications of the previous myomectomy.  Patient has had wisdom teeth extraction and she recalls that she had some prolonged bleeding after the procedure.  01/26/2022, mammogram left diagnostic showed indeterminate left breast calcification.  Recommend stereo tactic biopsy 02/10/2022, stereotactic biopsy showed focal atypical ductal hyperplasia.  Fibrocystic changes with calcifications. 04/01/2022, left breast lumpectomy showed benign breast tissue with fibrocystic changes, coronary surgeon, fibroadenomatoid change, apocrine metaplasia and focal usual ductal hyperplasia.  Negative for atypia/malignancy in sections examined.   INTERVAL HISTORY Susan Drake is a 42 y.o. female who has above history reviewed by me today presents for follow up visit for thrombocytopenia, recent diagnosis of atypical ductal hyperplasia. The patient reports feeling well.  No concerns at the surgical sites.  No bleeding events.  Easy bruising has improved.   She takes vitamin B12 supplementation  several times per week.    Review of Systems  Constitutional:  Negative for appetite change, chills, fatigue and fever.  HENT:   Negative for hearing loss and voice change.   Eyes:  Negative for eye problems.  Respiratory:  Negative for chest tightness and cough.   Cardiovascular:  Negative for chest  pain.  Gastrointestinal:  Negative for abdominal distention, abdominal pain and blood in stool.  Endocrine: Negative for hot flashes.  Genitourinary:  Negative for difficulty urinating and frequency.   Musculoskeletal:   Negative for arthralgias.  Skin:  Negative for itching and rash.  Neurological:  Negative for extremity weakness.  Hematological:  Negative for adenopathy. Does not bruise/bleed easily.  Psychiatric/Behavioral:  Negative for confusion.     MEDICAL HISTORY:  Past Medical History:  Diagnosis Date   Anxiety    Constipation    sees Dr. Charna Elizabeth   Dermatological disease    sees Dr. Terri Piedra, bilat upper arms and chest   Diverticulosis    Mild case   Former smoker    quit 08/2021   History of chlamydia    hx/o trichomonas and chlamydia in remote past   Liver cyst    Seasonal allergic rhinitis    Uterine fibroid     SURGICAL HISTORY: Past Surgical History:  Procedure Laterality Date   BREAST BIOPSY Left    BREAST LUMPECTOMY WITH RADIOACTIVE SEED LOCALIZATION Left 04/01/2022   Procedure: LEFT BREAST LUMPECTOMY WITH RADIOACTIVE SEED LOCALIZATION;  Surgeon: Manus Rudd, MD;  Location: [facility];  Service: General;  Laterality: Left;   COLONOSCOPY     no prior colonoscopy   MYOMECTOMY  05/03/2012   Procedure: MYOMECTOMY;  Surgeon: Serita Kyle, MD;  Location: [facility];  Service: Gynecology;  Laterality: N/A;   Exploratory Laparotomy myomectomy. Open at 1410.   MYOMECTOMY  06/21/2019   Exploratory Laparotomy MYOMECTOMY (N/A Abdomen)   MYOMECTOMY N/A 06/21/2019   Procedure: Exploratory Laparotomy MYOMECTOMY;  Surgeon: Maxie Better, MD;  Location: [facility];  Service: Gynecology;  Laterality: N/A;   ROBOT ASSISTED MYOMECTOMY  05/03/2012   Procedure: ROBOTIC ASSISTED MYOMECTOMY;  Surgeon: Serita Kyle, MD;  Location: [facility];  Service: Gynecology;  Laterality: N/A;  Times two fibroids with robot.   WISDOM TOOTH EXTRACTION  2011    SOCIAL HISTORY: Social History   Socioeconomic History   Marital status: Married    Spouse name: Not on file   Number of children: Not on file   Years of education: Not on file   Highest education level: Not on file   Occupational History   Not on file  Tobacco Use   Smoking status: Former    Types: Cigars   Smokeless tobacco: Never   Tobacco comments:    cigars occaionally  Vaping Use   Vaping Use: Never used  Substance and Sexual Activity   Alcohol use: Yes    Comment: socially   Drug use: No   Sexual activity: Not on file  Other Topics Concern   Not on file  Social History Narrative   Married, no children, exercise - some with treadmill, cardio, goes to gym, works at [employer].  08/2021   Social Determinants of Health   Financial Resource Strain: Not on file  Food Insecurity: Not on file  Transportation Needs: Not on file  Physical Activity: Not on file  Stress: Not on file  Social Connections: Not on file  Intimate Partner Violence: Not on file    FAMILY HISTORY: Family History  Problem Relation Age of Onset   COPD Mother    Appendicitis Mother    Non-Hodgkin's lymphoma Mother 30   Aortic aneurysm Mother    COPD Father        emphysema   Hypertension Father    Eczema Father    Kidney disease  Sister    Kidney cancer Sister 22   Cerebral palsy Sister    Fibroids Sister    Arthritis Brother    Cancer Maternal Uncle        liver   Breast cancer Paternal Aunt 29   Hypertension Cousin    Heart disease Neg Hx    Stroke Neg Hx    Diabetes Neg Hx     ALLERGIES:  is allergic to other.  MEDICATIONS:  Current Outpatient Medications  Medication Sig Dispense Refill   Ciclopirox 1 % shampoo Apply topically.     fluticasone (FLONASE) 50 MCG/ACT nasal spray SPRAY 2 SPRAYS INTO EACH NOSTRIL EVERY DAY 16 mL 3   ibuprofen (ADVIL) 600 MG tablet Take 1 tablet (600 mg total) by mouth 2 (two) times daily as needed. 30 tablet 0   Probiotic Product (PROBIOTIC DAILY PO) Take 1 capsule by mouth daily.      vitamin B-12 (CYANOCOBALAMIN) 1000 MCG tablet Take 1,000 mcg by mouth daily.     Vitamin D, Ergocalciferol, (DRISDOL) 1.25 MG (50000 UNIT) CAPS capsule Take 1 capsule (50,000  Units total) by mouth every 7 (seven) days. 12 capsule 1   No current facility-administered medications for this visit.     PHYSICAL EXAMINATION: ECOG PERFORMANCE STATUS: 0 - Asymptomatic Vitals:   04/22/23 1207  BP: (!) 135/96  Pulse: (!) 105  Temp: 97.8 F (36.6 C)  SpO2: 100%   Filed Weights   04/22/23 1207  Weight: 160 lb 4.8 oz (72.7 kg)     Physical Exam Constitutional:      General: She is not in acute distress. HENT:     Head: Normocephalic and atraumatic.  Eyes:     General: No scleral icterus. Cardiovascular:     Rate and Rhythm: Normal rate.  Pulmonary:     Effort: Pulmonary effort is normal. No respiratory distress.  Abdominal:     General: There is no distension.  Musculoskeletal:        General: No deformity. Normal range of motion.     Cervical back: Normal range of motion and neck supple.  Skin:    Findings: No rash.  Neurological:     Mental Status: She is alert and oriented to person, place, and time. Mental status is at baseline.     Cranial Nerves: No cranial nerve deficit.  Psychiatric:        Mood and Affect: Mood normal.      LABORATORY DATA:  I have reviewed the data as listed     Latest Ref Rng & Units 04/20/2023    1:36 PM 02/11/2023    3:02 PM 08/23/2022   10:19 AM  CBC  WBC 4.0 - 10.5 K/uL 7.6  5.1  5.5   Hemoglobin 12.0 - 15.0 g/dL 16.6  06.3  01.6   Hematocrit 36.0 - 46.0 % 41.3  43.5  42.9   Platelets 150 - 400 K/uL 162  184  161       Latest Ref Rng & Units 04/20/2023    1:36 PM 08/23/2022   10:19 AM 04/15/2022    4:26 PM  CMP  Glucose 70 - 99 mg/dL 85  83  85   BUN 6 - 20 mg/dL 18  13  13    Creatinine 0.44 - 1.00 mg/dL 0.10  9.32  3.55   Sodium 135 - 145 mmol/L 134  138  135   Potassium 3.5 - 5.1 mmol/L 4.0  4.1  3.8   Chloride 98 -  111 mmol/L 100  102  104   CO2 22 - 32 mmol/L 24  18  25    Calcium 8.9 - 10.3 mg/dL 8.8  9.5  8.6   Total Protein 6.5 - 8.1 g/dL 7.5   7.5   Total Bilirubin 0.3 - 1.2 mg/dL 0.8    0.6   Alkaline Phos 38 - 126 U/L 31   48   AST 15 - 41 U/L 20   19   ALT 0 - 44 U/L 19   15     08/21/2021 HIV negative, hepatitis C negative   RADIOGRAPHIC STUDIES: I have personally reviewed the radiological images as listed and agreed with the findings in the report.  No results found.

## 2023-06-17 ENCOUNTER — Other Ambulatory Visit: Payer: Self-pay | Admitting: Medical

## 2023-07-12 LAB — HM PAP SMEAR
Chlamydia, Swab/Urine, PCR: NEGATIVE
HPV, high-risk: NEGATIVE

## 2023-08-09 ENCOUNTER — Other Ambulatory Visit: Payer: Self-pay | Admitting: Medical

## 2023-08-25 ENCOUNTER — Encounter: Payer: 59 | Admitting: Medical

## 2023-09-17 ENCOUNTER — Other Ambulatory Visit: Payer: Self-pay | Admitting: Medical

## 2023-10-12 ENCOUNTER — Ambulatory Visit: Payer: 59 | Admitting: Medical

## 2023-10-12 ENCOUNTER — Encounter: Payer: Self-pay | Admitting: Medical

## 2023-10-12 VITALS — BP 110/70 | HR 58 | Ht 63.0 in | Wt 159.2 lb

## 2023-10-12 DIAGNOSIS — M7631 Iliotibial band syndrome, right leg: Secondary | ICD-10-CM

## 2023-10-12 DIAGNOSIS — M25551 Pain in right hip: Secondary | ICD-10-CM

## 2023-10-12 DIAGNOSIS — Z0000 Encounter for general adult medical examination without abnormal findings: Secondary | ICD-10-CM

## 2023-10-12 DIAGNOSIS — K5909 Other constipation: Secondary | ICD-10-CM

## 2023-10-12 DIAGNOSIS — R232 Flushing: Secondary | ICD-10-CM

## 2023-10-12 DIAGNOSIS — E559 Vitamin D deficiency, unspecified: Secondary | ICD-10-CM | POA: Diagnosis not present

## 2023-10-12 DIAGNOSIS — M545 Low back pain, unspecified: Secondary | ICD-10-CM

## 2023-10-12 DIAGNOSIS — Z7185 Encounter for immunization safety counseling: Secondary | ICD-10-CM

## 2023-10-12 DIAGNOSIS — R82998 Other abnormal findings in urine: Secondary | ICD-10-CM

## 2023-10-12 DIAGNOSIS — M7918 Myalgia, other site: Secondary | ICD-10-CM

## 2023-10-12 HISTORY — DX: Other abnormal findings in urine: R82.998

## 2023-10-12 HISTORY — DX: Pain in right hip: M25.551

## 2023-10-12 LAB — POCT URINALYSIS DIP (PROADVANTAGE DEVICE)
Bilirubin, UA: NEGATIVE
Blood, UA: NEGATIVE
Glucose, UA: NEGATIVE mg/dL
Ketones, POC UA: NEGATIVE mg/dL
Leukocytes, UA: NEGATIVE
Nitrite, UA: NEGATIVE
Protein Ur, POC: NEGATIVE mg/dL
Specific Gravity, Urine: 1.01
Urobilinogen, Ur: NEGATIVE
pH, UA: 6 (ref 5.0–8.0)

## 2023-10-12 NOTE — Patient Instructions (Addendum)
Massage therapy Susan Drake [phone] [email or web address]

## 2023-10-12 NOTE — Progress Notes (Signed)
Subjective:   HPI  Susan Drake is a 42 y.o. female who presents for Chief Complaint  Patient presents with   Annual Exam    Fasting Cpe, still having hip trouble.     Patient Care Team: Kensie Susman, Kermit Balo, PA-C as PCP - General (Family Medicine) Manus Rudd, MD as Consulting Physician (General Surgery) Rickard Patience, MD as Consulting Physician (Oncology) Maxie Better, MD as Consulting Physician (Obstetrics and Gynecology) Shirleen Schirmer, PA-C as Physician Assistant (Internal Medicine) Basilio Cairo, MD (Dermatology) Lacy Duverney T as Counselor (Genetic Counselor) Sees dentist Sees eye doctor  Concerns: Wants hormones checked.  Has hot flashes quite often.  Has trouble sleeping because of the hot flashes.  Still having ongoing pains in her right hip, right buttock and low back.  She is seeing physical therapy a few months ago which helped.  The pain is worse going up and down stairs.  She does a home exercise and stretching that was demonstrated by physical therapy.  No numbness or tingling or weakness.  Still gets foamy urine occasionally  Reviewed their medical, surgical, family, social, medication, and allergy history and updated chart as appropriate.  Review of Systems  Constitutional:  Negative for chills, fever, malaise/fatigue and weight loss.  HENT:  Negative for congestion, ear pain, hearing loss, sore throat and tinnitus.   Eyes:  Negative for blurred vision, pain and redness.  Respiratory:  Negative for cough, hemoptysis and shortness of breath.   Cardiovascular:  Negative for chest pain, palpitations, orthopnea, claudication and leg swelling.  Gastrointestinal:  Negative for abdominal pain, blood in stool, constipation, diarrhea, nausea and vomiting.  Genitourinary:  Negative for dysuria, flank pain, frequency, hematuria and urgency.  Musculoskeletal:  Positive for joint pain and myalgias. Negative for falls.  Skin:  Negative for itching and rash.   Neurological:  Negative for dizziness, tingling, speech change, weakness and headaches.  Endo/Heme/Allergies:  Negative for polydipsia. Does not bruise/bleed easily.  Psychiatric/Behavioral:  Negative for depression and memory loss. The patient is not nervous/anxious and does not have insomnia.     Past Medical History:  Diagnosis Date   Anxiety    Constipation    sees Dr. Charna Elizabeth   Dermatological disease    sees Dr. Terri Piedra, bilat upper arms and chest   Diverticulosis    Mild case   Former smoker    quit 08/2021   History of chlamydia    hx/o trichomonas and chlamydia in remote past   Liver cyst    Seasonal allergic rhinitis    Uterine fibroid     Family History  Problem Relation Age of Onset   COPD Mother    Appendicitis Mother    Non-Hodgkin's lymphoma Mother 61   Aortic aneurysm Mother    AAA (abdominal aortic aneurysm) Mother    COPD Father        emphysema   Hypertension Father    Eczema Father    Kidney disease Sister    Kidney cancer Sister 34   Cerebral palsy Sister    Fibroids Sister    Arthritis Brother    Cancer Maternal Uncle        liver   Breast cancer Paternal Aunt 82   Heart disease Paternal Grandfather        died of MI   Hypertension Cousin    Stroke Neg Hx    Diabetes Neg Hx      Current Outpatient Medications:    Ciclopirox 1 % shampoo, Apply  topically., Disp: , Rfl:    fluticasone (FLONASE) 50 MCG/ACT nasal spray, SPRAY 2 SPRAYS INTO EACH NOSTRIL EVERY DAY, Disp: 16 mL, Rfl: 3   Probiotic Product (PROBIOTIC DAILY PO), Take 1 capsule by mouth daily. , Disp: , Rfl:    vitamin B-12 (CYANOCOBALAMIN) 1000 MCG tablet, Take 1,000 mcg by mouth daily., Disp: , Rfl:    Vitamin D, Ergocalciferol, (DRISDOL) 1.25 MG (50000 UNIT) CAPS capsule, TAKE 1 CAPSULE (50,000 UNITS TOTAL) BY MOUTH EVERY 7 (SEVEN) DAYS, Disp: 4 capsule, Rfl: 0  Allergies  Allergen Reactions   Other Hives    Dermabond         10/12/2023    9:51 AM 08/23/2022    10:00 AM 08/21/2021   10:41 AM 07/01/2021    3:01 PM 04/04/2020   11:48 AM  Depression screen PHQ 2/9  Decreased Interest 0 0 0 0 0  Down, Depressed, Hopeless 0 0 0 0 0  PHQ - 2 Score 0 0 0 0 0       Objective:  BP 110/70   Pulse (!) 58   Ht 5\' 3"  (1.6 m)   Wt 159 lb 3.2 oz (72.2 kg)   LMP 09/03/2023   BMI 28.20 kg/m   Wt Readings from Last 3 Encounters:  10/12/23 159 lb 3.2 oz (72.2 kg)  04/22/23 160 lb 4.8 oz (72.7 kg)  02/11/23 158 lb 6.4 oz (71.8 kg)    General appearance: alert, no distress, WD/WN, African American female Skin:unremarkable, tattoo low back, tattoo right forearm of roman numerals HEENT: normocephalic, conjunctiva/corneas normal, sclerae anicteric, PERRLA, EOMi, nares patent, no discharge or erythema, pharynx normal Oral cavity: MMM, tongue normal, teeth normal Neck: supple, no lymphadenopathy, no thyromegaly, no masses, normal ROM, no bruits Chest: non tender, normal shape and expansion Heart: RRR, normal S1, S2, no murmurs Lungs: CTA bilaterally, no wheezes, rhonchi, or rales Abdomen: +bs, soft, surgical port scars noted, non tender, non distended, no masses, no hepatomegaly, no splenomegaly, no bruits Back: Tender left upper paraspinal region, tender right lower paraspinal region,, relatively normal range of motion, otherwise non tender,  no scoliosis Musculoskeletal:  tender right trochanter bursa and IT band on right, otherwise upper extremities non tender, no obvious deformity, normal ROM throughout, lower extremities non tender, no obvious deformity, normal ROM throughout Extremities: no edema, no cyanosis, no clubbing Pulses: 2+ symmetric, upper and lower extremities, normal cap refill Neurological: alert, oriented x 3, CN2-12 intact, strength normal upper extremities and lower extremities, sensation normal throughout, DTRs 2+ throughout, no cerebellar signs, gait normal Psychiatric: normal affect, behavior normal, pleasant  Breast/gyn/rectal -  deferred to gynecology     Assessment and Plan :   Encounter Diagnoses  Name Primary?   Encounter for health maintenance examination in adult Yes   Acute low back pain without sciatica, unspecified back pain laterality    Foamy urine    Vitamin D deficiency    Vaccine counseling    Chronic constipation    Hot flashes    Myofascial muscle pain    It band syndrome, right    Pain of right hip     This visit was a preventative care visit, also known as wellness visit or routine physical.   Topics typically include healthy lifestyle, diet, exercise, preventative care, vaccinations, sick and well care, proper use of emergency dept and after hours care, as well as other concerns.     Recommendations: Continue to return yearly for your annual wellness and preventative care visits.  This gives [place] a chance to discuss healthy lifestyle, exercise, vaccinations, review your chart record, and perform screenings where appropriate.  I recommend you see your eye doctor yearly for routine vision care.  I recommend you see your dentist yearly for routine dental care including hygiene visits twice yearly.   Vaccination recommendations were reviewed Immunization History  Administered Date(s) Administered   Influenza Inj Mdck Quad Pf 09/30/2022   Influenza,inj,Quad PF,6+ Mos 09/27/2018, 09/12/2019   Influenza-Unspecified 10/01/2021   PFIZER Comirnaty(Gray Top)Covid-19 Tri-Sucrose Vaccine 01/17/2020, 02/12/2020, 09/08/2020   Pfizer Covid-19 Vaccine Bivalent Booster 51yrs & up 09/30/2021   Due for Tdap.  Last Tdap 2012.   Due for flu shot.  She declines today   Screening for cancer: Colon cancer screening: Reviewed 12/21/22 colonoscopy report, diverticulosis, otherwise normal.  Repeat in 10 years.  Breast cancer screening: You should perform a self breast exam monthly.   03/2023 up to date  Cervical cancer screening: Pap 05/2022 up to date  Skin cancer screening: Check your skin regularly  for new changes, growing lesions, or other lesions of concern Come in for evaluation if you have skin lesions of concern.  Lung cancer screening: If you have a greater than 20 pack year history of tobacco use, then you may qualify for lung cancer screening with a chest CT scan.   Please call your insurance company to inquire about coverage for this test.  We currently don't have screenings for other cancers besides breast, cervical, colon, and lung cancers.  If you have a strong family history of cancer or have other cancer screening concerns, please let me know.    Bone health: Get at least 150 minutes of aerobic exercise weekly Get weight bearing exercise at least once weekly Bone density test:  A bone density test is an imaging test that uses a type of X-ray to measure the amount of calcium and other minerals in your bones. The test may be used to diagnose or screen you for a condition that causes weak or thin bones (osteoporosis), predict your risk for a broken bone (fracture), or determine how well your osteoporosis treatment is working. The bone density test is recommended for females 65 and older, or females or males <65 if certain risk factors such as thyroid disease, long term use of steroids such as for asthma or rheumatological issues, vitamin D deficiency, estrogen deficiency, family history of osteoporosis, self or family history of fragility fracture in first degree relative.    Heart health: Get at least 150 minutes of aerobic exercise weekly Limit alcohol It is important to maintain a healthy blood pressure and healthy cholesterol numbers  Heart disease screening: Screening for heart disease includes screening for blood pressure, fasting lipids, glucose/diabetes screening, BMI height to weight ratio, reviewed of smoking status, physical activity, and diet.    Goals include blood pressure 120/80 or less, maintaining a healthy lipid/cholesterol profile, preventing diabetes or  keeping diabetes numbers under good control, not smoking or using tobacco products, exercising most days per week or at least 150 minutes per week of exercise, and eating healthy variety of fruits and vegetables, healthy oils, and avoiding unhealthy food choices like fried food, fast food, high sugar and high cholesterol foods.    Other tests may possibly include EKG test, CT coronary calcium score, echocardiogram, exercise treadmill stress test.    Medical care options: I recommend you continue to seek care here first for routine care.  We try really hard to have available appointments Monday  through Friday daytime hours for sick visits, acute visits, and physicals.  Urgent care should be used for after hours and weekends for significant issues that cannot wait till the next day.  The emergency department should be used for significant potentially life-threatening emergencies.  The emergency department is expensive, can often have long wait times for less significant concerns, so try to utilize primary care, urgent care, or telemedicine when possible to avoid unnecessary trips to the emergency department.  Virtual visits and telemedicine have been introduced since the pandemic started in 2020, and can be convenient ways to receive medical care.  We offer virtual appointments as well to assist you in a variety of options to seek medical care.   Advanced Directives: I recommend you consider completing a Health Care Power of Attorney and Living Will.   These documents respect your wishes and help alleviate burdens on your loved ones if you were to become terminally ill or be in a position to need those documents enforced.    You can complete Advanced Directives yourself, have them notarized, then have copies made for our office, for you and for anybody you feel should have them in safe keeping.  Or, you can have an attorney prepare these documents.   If you haven't updated your Last Will and Testament in  a while, it may be worthwhile having an attorney prepare these documents together and save on some costs.       Separate significant issues discussed: Chronic constipation - no recent concerns  Hx/o vitamin D deficiency -continue supplement  ITP - reviewed oncology notes eval from June 2024.  Stable, follow-up with hematology in June 2025  Recent lipid panel labs through work is okay  Right hip pain, IT band tenderness, back tenderness all suggestive of myofascial pain.  No obvious indication for x-ray today.  I reviewed prior lumbar x-ray from last year.  Referral to sports medicine for further consult.  She is already completed physical therapy earlier this year with some improvement but still does this quite regularly.  Advise massage therapy, stretching  Lainey was seen today for annual exam.  Diagnoses and all orders for this visit:  Encounter for health maintenance examination in adult -     Basic metabolic panel -     Microalbumin/Creatinine Ratio, Urine -     TSH -     FSH/LH -     Estrogens, Total -     Ambulatory referral to Sports Medicine  Acute low back pain without sciatica, unspecified back pain laterality -     POCT Urinalysis DIP (Proadvantage Device) -     Basic metabolic panel -     Microalbumin/Creatinine Ratio, Urine  Foamy urine -     POCT Urinalysis DIP (Proadvantage Device) -     Basic metabolic panel -     Microalbumin/Creatinine Ratio, Urine  Vitamin D deficiency  Vaccine counseling  Chronic constipation  Hot flashes -     TSH -     FSH/LH -     Estrogens, Total  Myofascial muscle pain -     Ambulatory referral to Sports Medicine  It band syndrome, right -     Ambulatory referral to Sports Medicine  Pain of right hip -     Ambulatory referral to Sports Medicine    Follow-up pending labs, yearly for physical

## 2023-10-14 LAB — ESTROGENS, TOTAL: Estrogen: 53 pg/mL

## 2023-10-14 LAB — BASIC METABOLIC PANEL
BUN/Creatinine Ratio: 11 (ref 9–23)
BUN: 9 mg/dL (ref 6–24)
CO2: 25 mmol/L (ref 20–29)
Calcium: 9.5 mg/dL (ref 8.7–10.2)
Chloride: 103 mmol/L (ref 96–106)
Creatinine, Ser: 0.84 mg/dL (ref 0.57–1.00)
Glucose: 84 mg/dL (ref 70–99)
Potassium: 4.4 mmol/L (ref 3.5–5.2)
Sodium: 140 mmol/L (ref 134–144)
eGFR: 88 mL/min/{1.73_m2} (ref >59)

## 2023-10-14 LAB — MICROALBUMIN / CREATININE URINE RATIO
Creatinine, Urine: 56.5 mg/dL
Microalb/Creat Ratio: 9 mg/g{creat} (ref 0–29)
Microalbumin, Urine: 4.9 ug/mL

## 2023-10-14 LAB — TSH: TSH: 1.32 u[IU]/mL (ref 0.450–4.500)

## 2023-10-14 LAB — FSH/LH
FSH: 72.7 m[IU]/mL
LH: 41.4 m[IU]/mL

## 2023-10-14 NOTE — Progress Notes (Signed)
Results sent through MyChart

## 2023-10-17 ENCOUNTER — Ambulatory Visit (INDEPENDENT_AMBULATORY_CARE_PROVIDER_SITE_OTHER): Payer: 59 | Admitting: Family Medicine

## 2023-10-17 ENCOUNTER — Other Ambulatory Visit: Payer: Self-pay | Admitting: Medical

## 2023-10-17 VITALS — BP 112/84 | Ht 63.0 in | Wt 160.0 lb

## 2023-10-17 DIAGNOSIS — M5416 Radiculopathy, lumbar region: Secondary | ICD-10-CM

## 2023-10-17 MED ORDER — VITAMIN D (ERGOCALCIFEROL) 1.25 MG (50000 UNIT) PO CAPS: 50000.0000 [IU] | ORAL_CAPSULE | ORAL | 3 refills | Status: DC

## 2023-10-17 NOTE — Patient Instructions (Addendum)
You have lumbar radiculopathy (a pinched nerve in your low back). Ok to take tylenol for baseline pain relief (1-2 extra strength tabs 3x/day) Take aleve 1-2 tabs twice a day with food for pain and inflammation as needed. Stay as active as possible. Continue home exercises you learned in physical therapy. Strengthening of low back muscles, abdominal musculature are key for long term pain relief. We will go ahead with an MRI of your lumbar spine. Next steps, follow up will depend on the MRI results.    DRI McKinley [address], [place], [place] [address] Phone: [phone]

## 2023-10-17 NOTE — Progress Notes (Signed)
Electrolytes normal, micral albumin kidney marker normal, thyroid ok.   We will forward copy of labs to gynecology  Expect phone call about referral to sports medicine

## 2023-10-18 NOTE — Progress Notes (Signed)
PCP: Jac Canavan, PA-C  Subjective:   HPI: Patient is a 42 y.o. female here for low back, right leg pain.  Patient reports she started to get pain about 4 years ago off and on. Noticed after walking outdoors that her right leg felt like it was heavy with pain lateral right hip. Involves pain right side of low back with radiation some down into her foot. Associated numbness. Right leg feels swollen at times. Has done 6 sessions of physical therapy at [facility] and home exercises - never completely improved however. Radiographs of lumbar spine were negative. Tried ibuprofen, norco. No bowel/bladder dysfunction.  Past Medical History:  Diagnosis Date   Anxiety    Constipation    sees Dr. Charna Elizabeth   Dermatological disease    sees Dr. Terri Piedra, bilat upper arms and chest   Diverticulosis    Mild case   Former smoker    quit 08/2021   History of chlamydia    hx/o trichomonas and chlamydia in remote past   Liver cyst    Seasonal allergic rhinitis    Uterine fibroid     Current Outpatient Medications on File Prior to Visit  Medication Sig Dispense Refill   Ciclopirox 1 % shampoo Apply topically.     fluticasone (FLONASE) 50 MCG/ACT nasal spray SPRAY 2 SPRAYS INTO EACH NOSTRIL EVERY DAY 16 mL 3   Probiotic Product (PROBIOTIC DAILY PO) Take 1 capsule by mouth daily.      vitamin B-12 (CYANOCOBALAMIN) 1000 MCG tablet Take 1,000 mcg by mouth daily.     No current facility-administered medications on file prior to visit.    Past Surgical History:  Procedure Laterality Date   BREAST BIOPSY Left    BREAST LUMPECTOMY WITH RADIOACTIVE SEED LOCALIZATION Left 04/01/2022   Procedure: LEFT BREAST LUMPECTOMY WITH RADIOACTIVE SEED LOCALIZATION;  Surgeon: Manus Rudd, MD;  Location: [facility];  Service: General;  Laterality: Left;   COLONOSCOPY  12/2022   [facility], diverticulosis, repeat 10 years   MYOMECTOMY  05/03/2012   Procedure: MYOMECTOMY;  Surgeon:  Serita Kyle, MD;  Location: [facility];  Service: Gynecology;  Laterality: N/A;   Exploratory Laparotomy myomectomy. Open at 1410.   MYOMECTOMY  06/21/2019   Exploratory Laparotomy MYOMECTOMY (N/A Abdomen)   MYOMECTOMY N/A 06/21/2019   Procedure: Exploratory Laparotomy MYOMECTOMY;  Surgeon: Maxie Better, MD;  Location: [facility];  Service: Gynecology;  Laterality: N/A;   ROBOT ASSISTED MYOMECTOMY  05/03/2012   Procedure: ROBOTIC ASSISTED MYOMECTOMY;  Surgeon: Serita Kyle, MD;  Location: [facility];  Service: Gynecology;  Laterality: N/A;  Times two fibroids with robot.   WISDOM TOOTH EXTRACTION  2011    Allergies  Allergen Reactions   Other Hives    Dermabond    BP 112/84   Ht 5\' 3"  (1.6 m)   Wt 160 lb (72.6 kg)   LMP 09/03/2023   BMI 28.34 kg/m       No data to display              No data to display              Objective:  Physical Exam:  Gen: NAD, comfortable in exam room  Back: No gross deformity, scoliosis. TTP right low lumbar paraspinal region, SI joint, lateral hip/IT band.  No midline or bony TTP. FROM. Strength LEs 5/5 all muscle groups. 2+ MSRs in patellar and achilles tendons, equal bilaterally. Negative SLRs. Sensation intact to light touch bilaterally.  Right hip/leg: No deformity. Full range of motion with 5/5 strength except 5-/5 right hip abduction. Tenderness to palpation greater trochanter, through IT band. Neurovascularly intact distally. Negative logroll Negative faber, fadir, and piriformis stretches.   Assessment & Plan:  1. Low back pain with radiation into right lower extremity.  Present for several years but off and on.  Consistent with lumbar radiculopathy.  Radiographs lumbar spine negative.  Not improving with physical therapy, home exercise program, ibuprofen.  Will proceed with MRI of lumbar spine.  Consider epidural steroid injections, surgical referral depending on results.  Aleve if needed, continue home  exercises in meantime.

## 2023-11-07 ENCOUNTER — Ambulatory Visit
Admission: RE | Admit: 2023-11-07 | Discharge: 2023-11-07 | Disposition: A | Discharge status: Home / Self Care | Payer: 59 | Source: Ambulatory Visit | Attending: Family Medicine | Admitting: Family Medicine

## 2023-11-07 DIAGNOSIS — M5416 Radiculopathy, lumbar region: Secondary | ICD-10-CM

## 2024-04-09 IMAGING — MG MM BREAST BX W LOC DEV 1ST LESION IMAGE BX SPEC STEREO GUIDE*L*
8 of 13 series · 8 of 29 positions shown · non-contrast
Comparison: Previous exams.
COMPARISON: Previous exams.

Addendum:
CLINICAL DATA: Patient presents for stereotactic guided core biopsy
of LEFT breast calcifications.

EXAM:
LEFT BREAST STEREOTACTIC CORE NEEDLE BIOPSY

[L (1 of 7)]
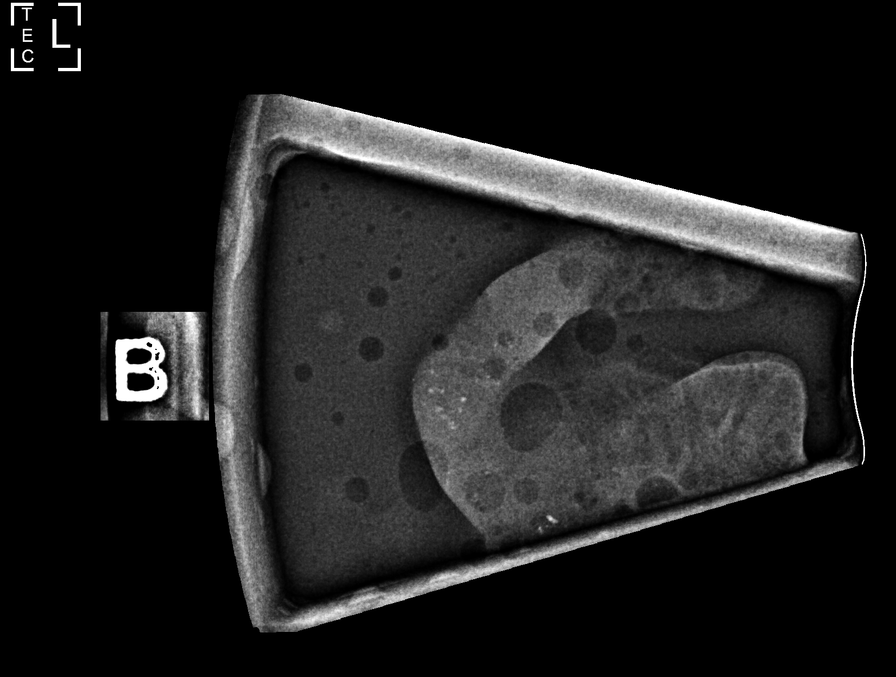

[L (2 of 7)]
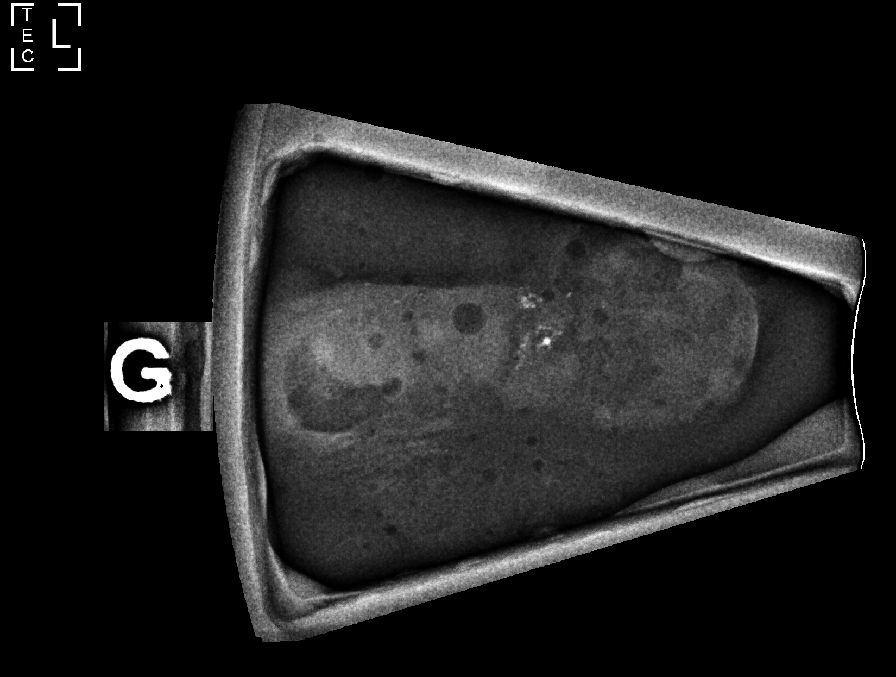

[L (3 of 7)]
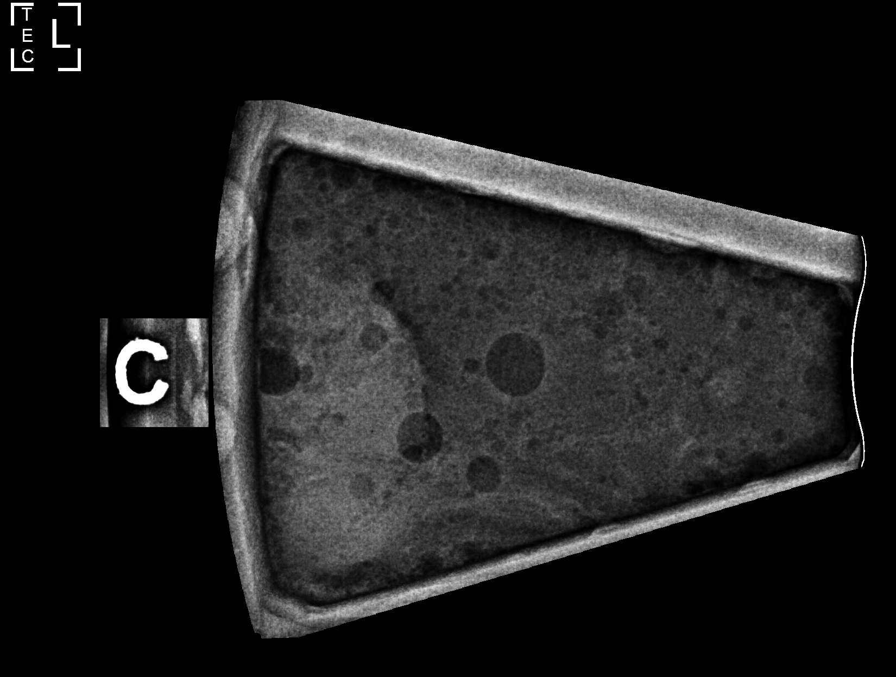

[L (4 of 7)]
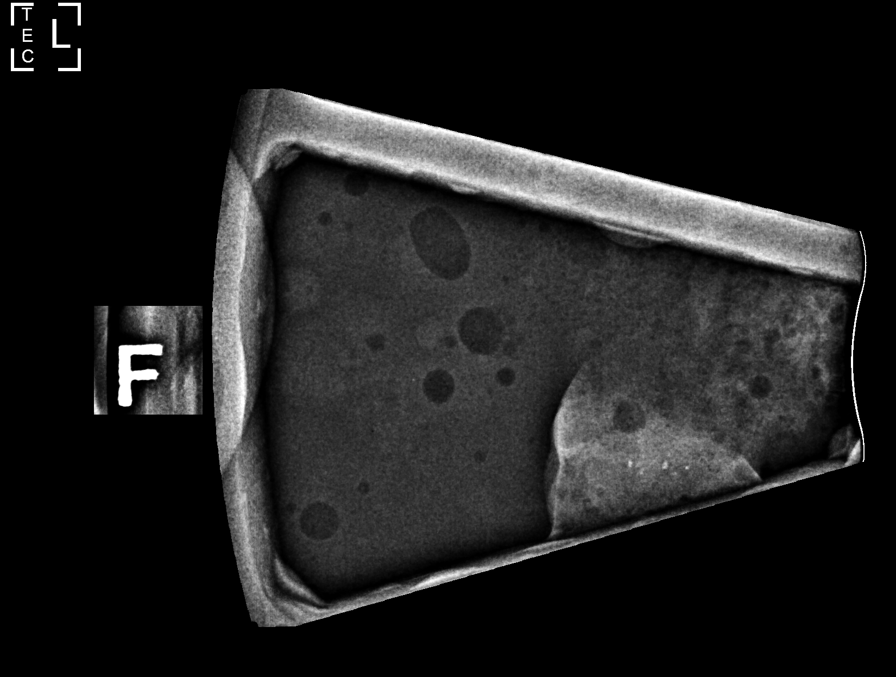

[L (5 of 7)]
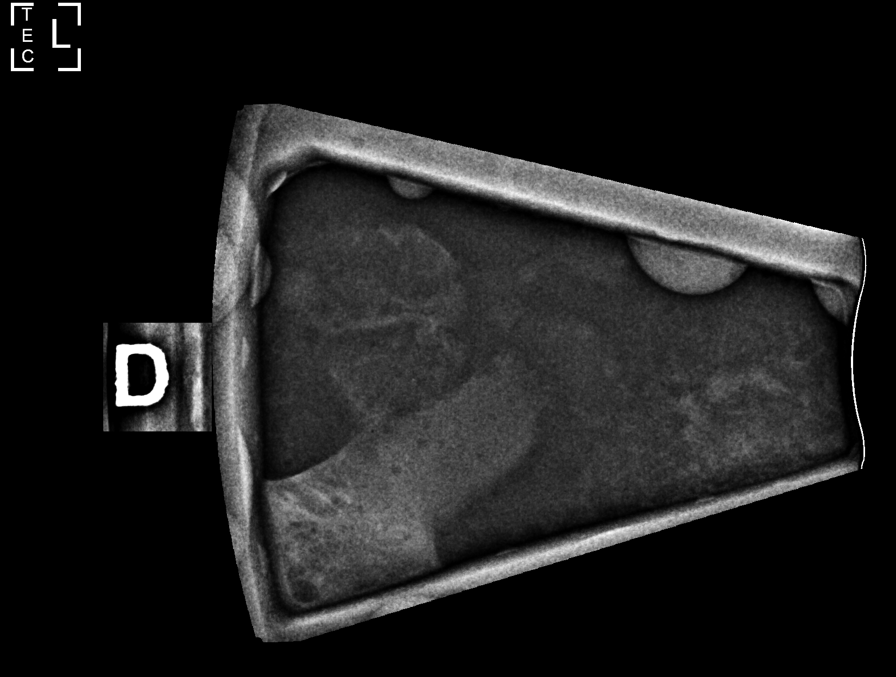

[L (6 of 7)]
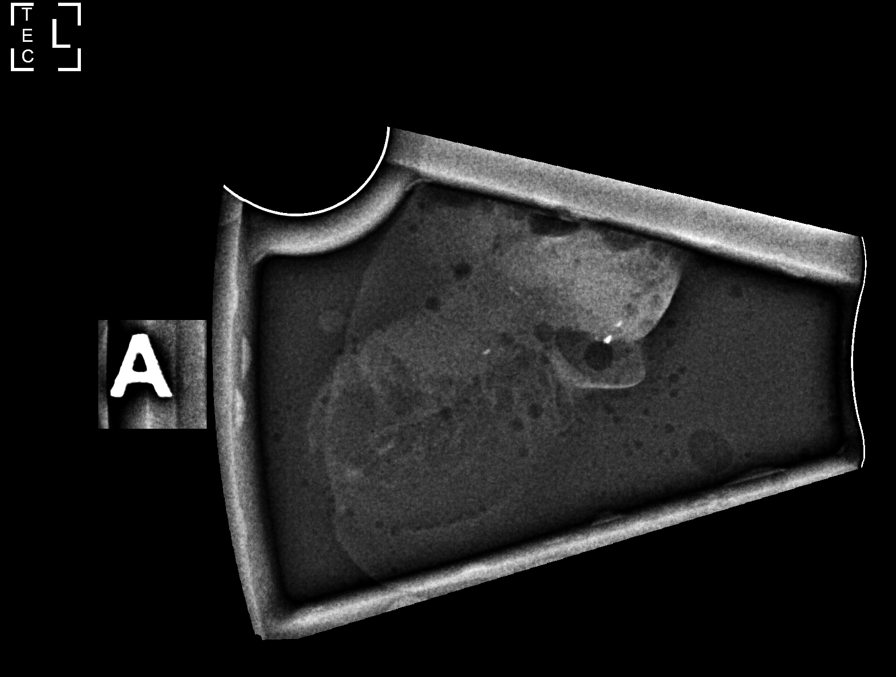

[L (7 of 7)]
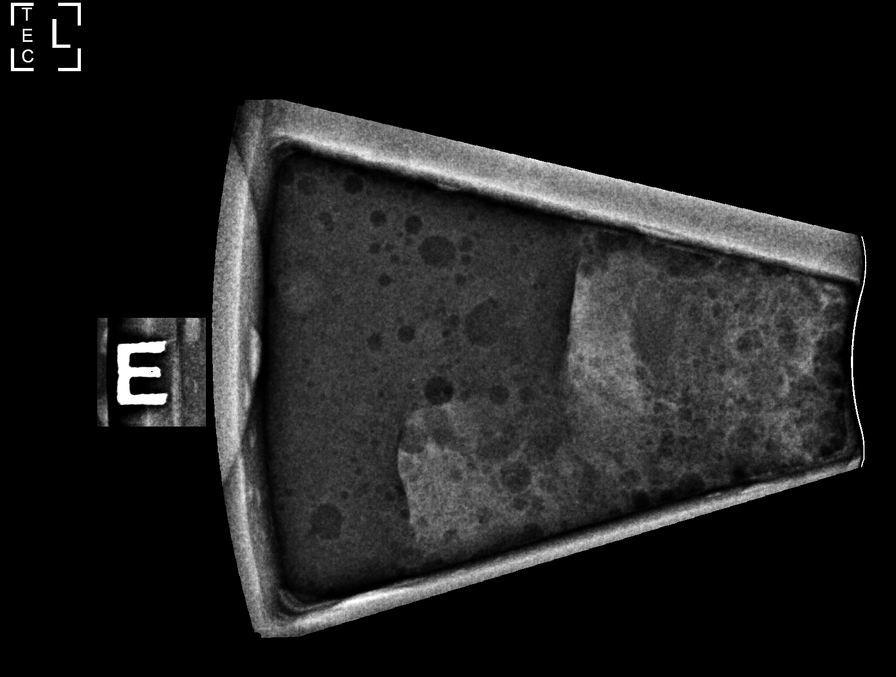

[L LM]
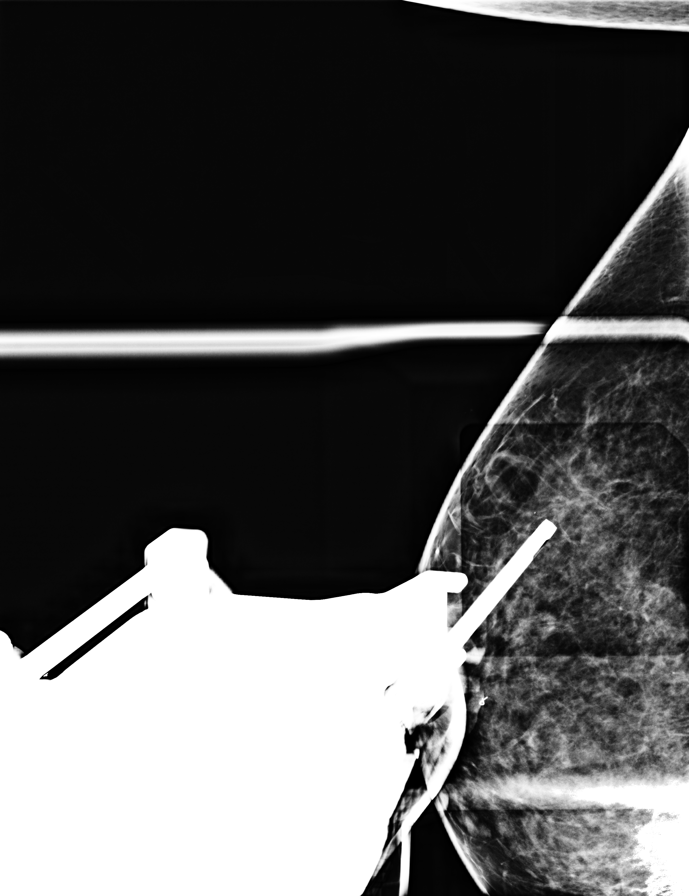

[8 of 29 positions shown; findings below may reference images not displayed]



Using sterile technique and 1% Lidocaine as local anesthetic, under
stereotactic guidance, a 9 gauge vacuum assisted device was used to
perform core needle biopsy of UPPER central LEFT breast using a
LATERAL approach. Specimen radiograph was performed showing
calcifications in numerous tissue samples. Specimens with
calcifications are identified for pathology.

Lesion quadrant: UPPER central LEFT breast

At the conclusion of the procedure, X shaped tissue marker clip was
deployed into the biopsy cavity. Follow-up 2-view mammogram was
performed and dictated separately.
IMPRESSION: Stereotactic-guided biopsy of LEFT breast calcifications. No
apparent complications.

ADDENDUM:
Pathology revealed FOCAL ATYPICAL DUCTAL HYPERPLASIA, FIBROCYSTIC
CHANGE WITH CALCIFICATIONS of the LEFT breast, superior, (x clip).
This was found to be concordant by Dr. Kentoy Causing, with
excision recommended.

Pathology results were discussed with the patient by telephone. The
patient reported doing well after the biopsy with oozing tenderness
at the site. Post biopsy instructions and care were reviewed and
questions were answered. The patient was encouraged to call The
direct phone number was provided.

Surgical consultation has been arranged with Dr. Mercy Yadira Lz, per
patient request, at [[email or web address]] on February 19, 2022.

Pathology results reported by Blondinacka Samsonaite, RN on 02/11/2022.



Using sterile technique and 1% Lidocaine as local anesthetic, under
stereotactic guidance, a 9 gauge vacuum assisted device was used to
perform core needle biopsy of UPPER central LEFT breast using a
LATERAL approach. Specimen radiograph was performed showing
calcifications in numerous tissue samples. Specimens with
calcifications are identified for pathology.

Lesion quadrant: UPPER central LEFT breast

At the conclusion of the procedure, X shaped tissue marker clip was
deployed into the biopsy cavity. Follow-up 2-view mammogram was
performed and dictated separately.
IMPRESSION: Stereotactic-guided biopsy of LEFT breast calcifications. No
apparent complications.

## 2024-04-09 IMAGING — MG MM BREAST LOCALIZATION CLIP
4 series · 4 of 12 positions shown · non-contrast
Comparison: Previous exam(s).

CLINICAL DATA: Status post stereotactic biopsy of LEFT breast
calcifications.

EXAM:
3D DIAGNOSTIC LEFT MAMMOGRAM POST STEREOTACTIC BIOPSY

[L CC synth-2D]
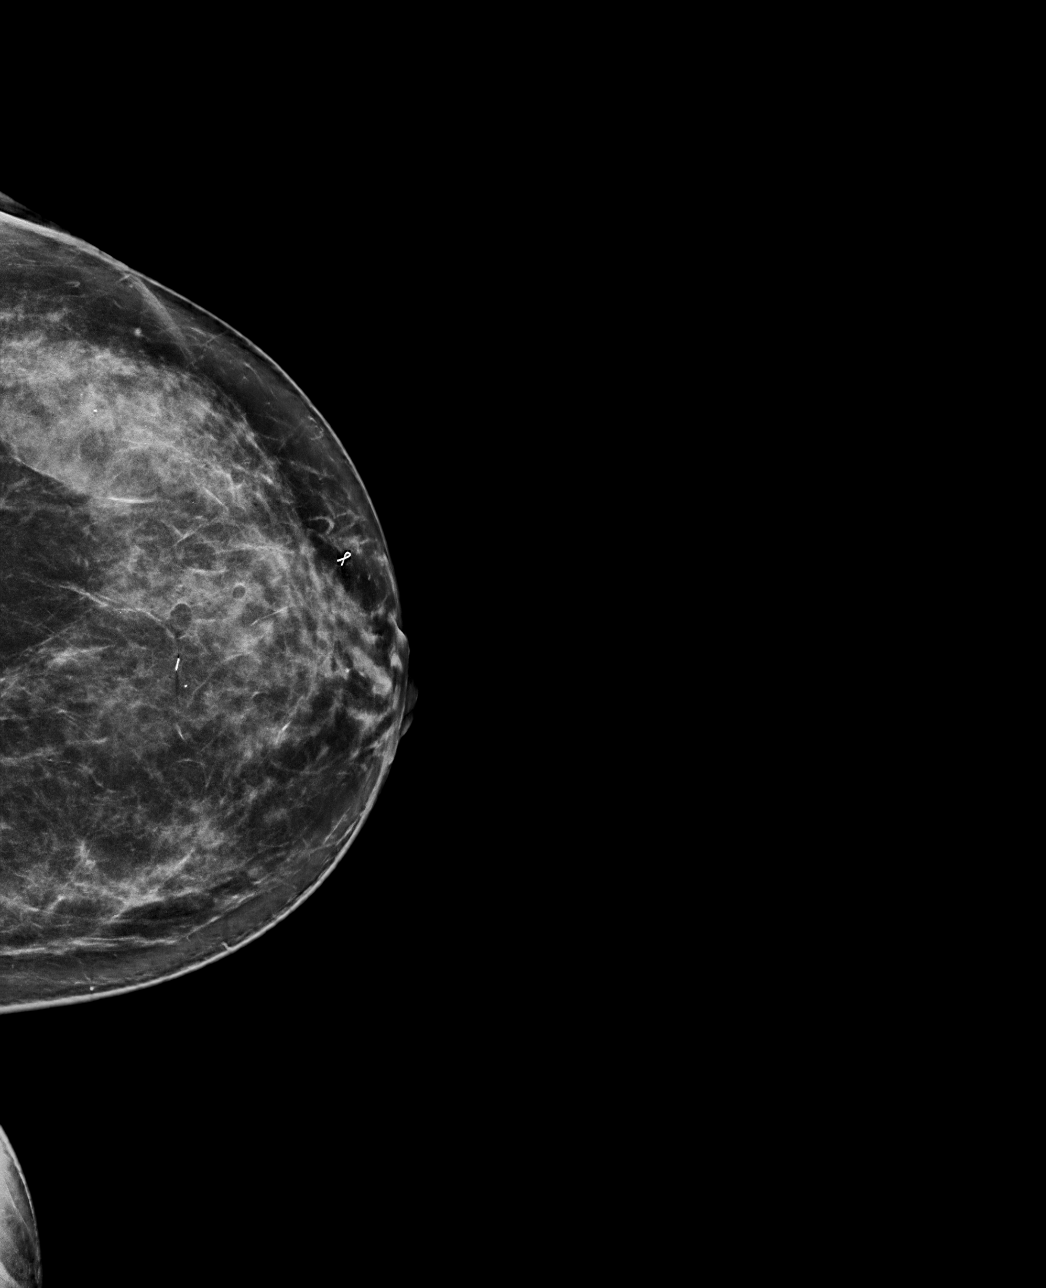

[L ML synth-2D]
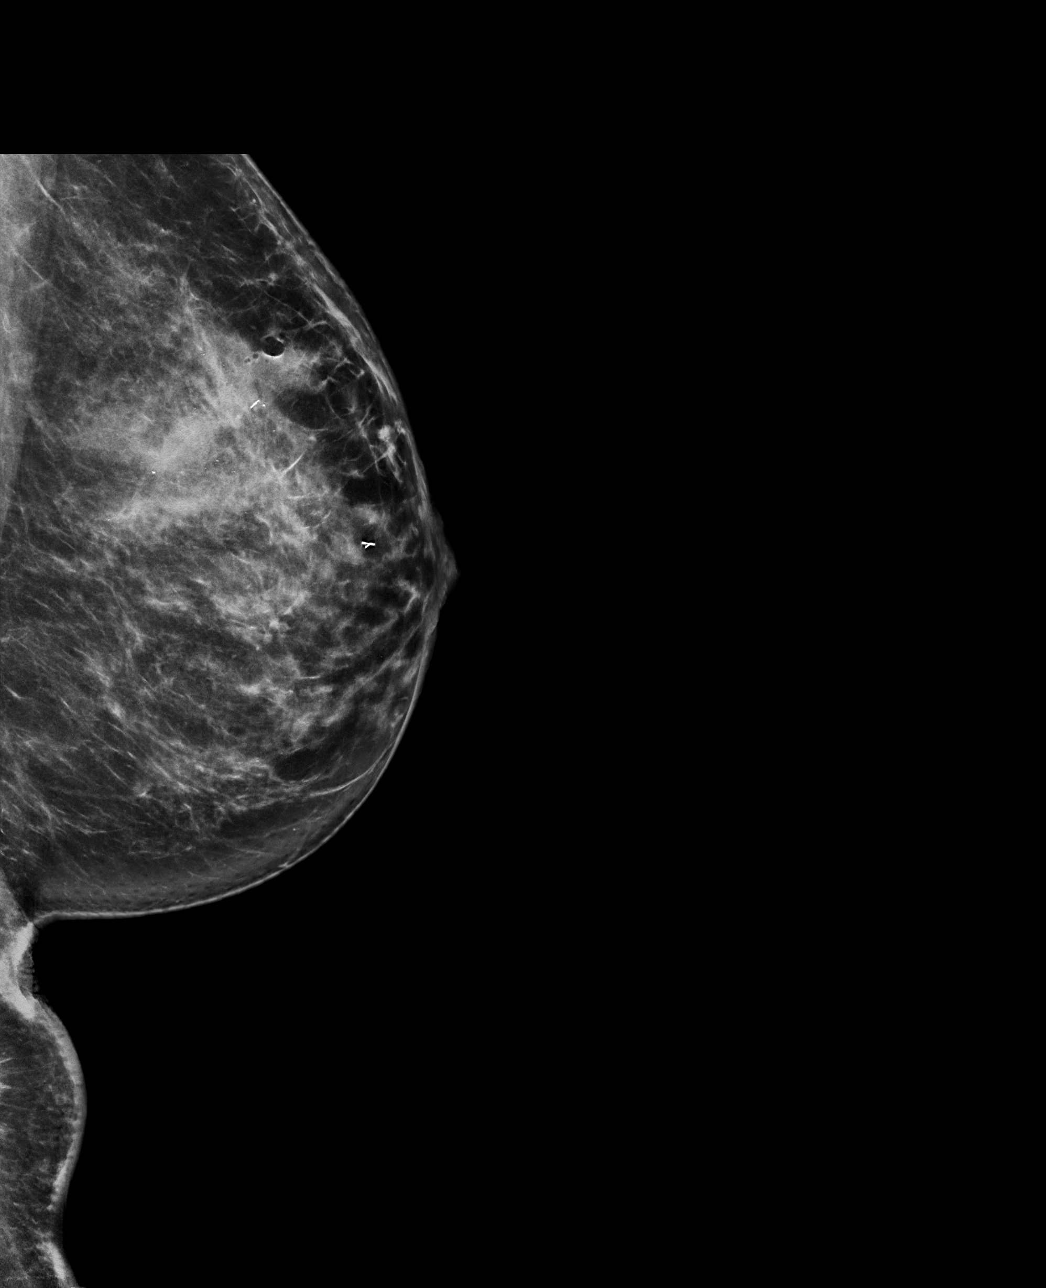

[L CC tomo · tomo slice 41/81.0]
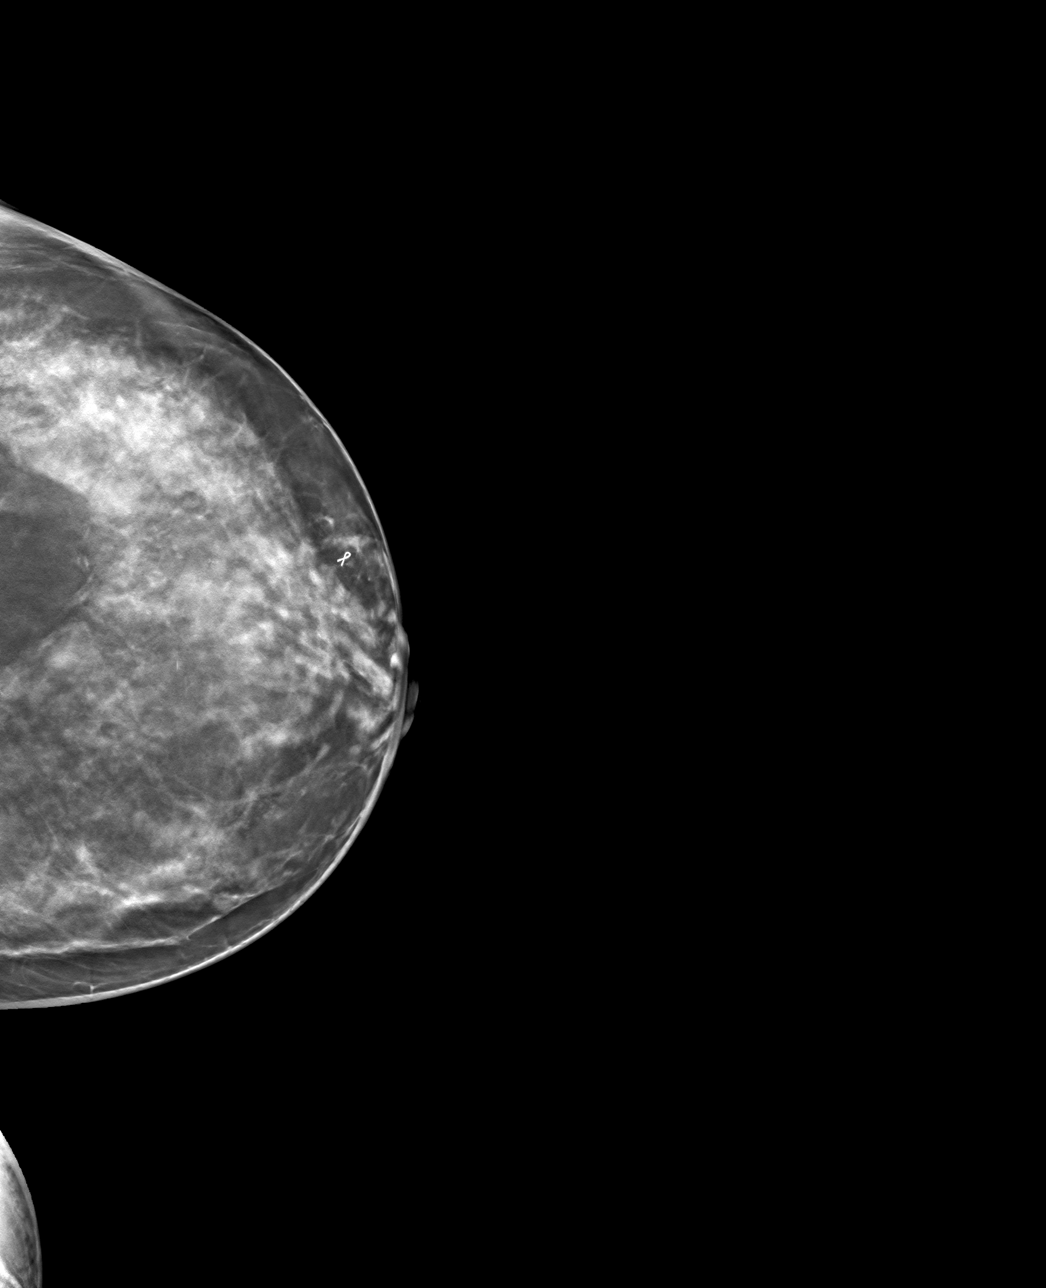

[L ML tomo · tomo slice 41/82.0]
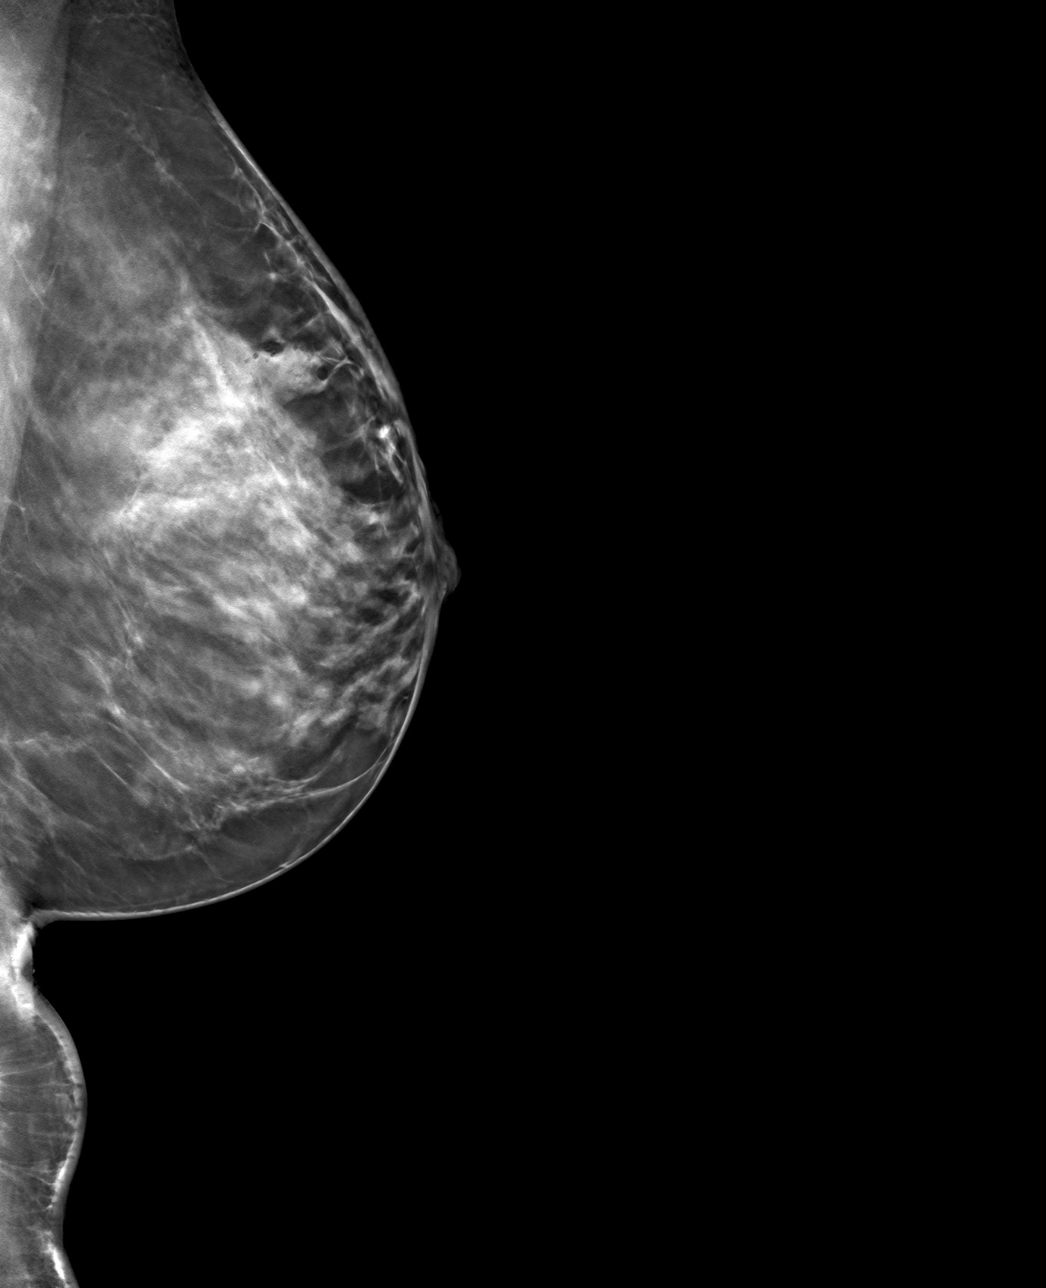

[4 of 12 positions shown; findings below may reference images not displayed]

FINDINGS: 3D Mammographic images were obtained following stereotactic guided
biopsy of calcifications in the UPPER central LEFT breast and
placement of an X shaped clip. The biopsy marking clip is in
expected position at the site of biopsy.
IMPRESSION: Appropriate positioning of the X shaped biopsy marking clip at the
site of biopsy in the UPPER central LEFT breast.

Final Assessment: Post Procedure Mammograms for Marker Placement

## 2024-04-16 ENCOUNTER — Inpatient Hospital Stay: Payer: 59

## 2024-04-23 ENCOUNTER — Ambulatory Visit: Payer: 59 | Admitting: Oncology

## 2024-04-28 LAB — HM MAMMOGRAPHY

## 2024-05-30 ENCOUNTER — Other Ambulatory Visit: Payer: Self-pay

## 2024-05-30 ENCOUNTER — Encounter: Payer: Self-pay | Admitting: Oncology

## 2024-05-30 DIAGNOSIS — D696 Thrombocytopenia, unspecified: Secondary | ICD-10-CM

## 2024-06-11 ENCOUNTER — Telehealth: Payer: Self-pay | Admitting: Internal Medicine

## 2024-06-11 ENCOUNTER — Encounter: Payer: Self-pay | Admitting: Medical

## 2024-06-11 ENCOUNTER — Ambulatory Visit: Admitting: Medical

## 2024-06-11 VITALS — BP 110/70 | HR 72 | Wt 161.6 lb

## 2024-06-11 DIAGNOSIS — R1011 Right upper quadrant pain: Secondary | ICD-10-CM

## 2024-06-11 DIAGNOSIS — R109 Unspecified abdominal pain: Secondary | ICD-10-CM | POA: Diagnosis not present

## 2024-06-11 DIAGNOSIS — R232 Flushing: Secondary | ICD-10-CM | POA: Diagnosis not present

## 2024-06-11 DIAGNOSIS — D259 Leiomyoma of uterus, unspecified: Secondary | ICD-10-CM

## 2024-06-11 DIAGNOSIS — N926 Irregular menstruation, unspecified: Secondary | ICD-10-CM

## 2024-06-11 DIAGNOSIS — G8929 Other chronic pain: Secondary | ICD-10-CM

## 2024-06-11 DIAGNOSIS — R61 Generalized hyperhidrosis: Secondary | ICD-10-CM

## 2024-06-11 DIAGNOSIS — D1803 Hemangioma of intra-abdominal structures: Secondary | ICD-10-CM

## 2024-06-11 LAB — POCT URINE PREGNANCY: Preg Test, Ur: NEGATIVE

## 2024-06-11 NOTE — Progress Notes (Signed)
 Subjective:  Susan Drake is a 43 y.o. female who presents for Chief Complaint  Patient presents with   Acute Visit    Hot flashes, night sweats and would like to have hormonal panel done. Symptoms was in December then stopped and started back up again in June. Patient hasn't had a cycle since June 7th.      Here for hot flashes, sweats, wants to repeat some labs similar to visit several months ago.  LMP  04/07/24.  Had cycle in April, May, and June, but not since June.  Around 10/2023 had missed a cycle for a few months.  She is concerned because she had similar symptoms back in December 2024.  Has hx/o fibrocystic breat tissue.   Normal breasts feel more fatty , less nodules than when younger.    She thinks she may be entering perimenopause but is also curious about other causes hot flashes and sweats.  No fevers.  No change in appetite.  No nausea or vomiting.  No urinary changes.  No blood in stool or urine.  No back pain.  No cough but sometimes feels a discomfort on her right side when taking a deep breath.  She works full-time but also helps take care of her mother.     No other aggravating or relieving factors.    No other c/o.  Past Medical History:  Diagnosis Date   Anxiety    Constipation    sees Dr. Renaye Sous   Dermatological disease    sees Dr. Ivin, bilat upper arms and chest   Diverticulosis    Mild case   Former smoker    quit 08/2021   History of chlamydia    hx/o trichomonas and chlamydia in remote past   Liver cyst    Seasonal allergic rhinitis    Uterine fibroid    Current Outpatient Medications on File Prior to Visit  Medication Sig Dispense Refill   fluticasone  (FLONASE ) 50 MCG/ACT nasal spray SPRAY 2 SPRAYS INTO EACH NOSTRIL EVERY DAY 16 mL 3   Probiotic Product (PROBIOTIC DAILY PO) Take 1 capsule by mouth daily.      vitamin B-12 (CYANOCOBALAMIN ) 1000 MCG tablet Take 1,000 mcg by mouth daily.     Vitamin D , Ergocalciferol , (DRISDOL ) 1.25 MG (50000  UNIT) CAPS capsule Take 1 capsule (50,000 Units total) by mouth every 7 (seven) days. 12 capsule 3   No current facility-administered medications on file prior to visit.   Family History  Problem Relation Age of Onset   COPD Mother    Appendicitis Mother    Non-Hodgkin's lymphoma Mother 47   Aortic aneurysm Mother    AAA (abdominal aortic aneurysm) Mother    COPD Father        emphysema   Hypertension Father    Eczema Father    Kidney disease Sister    Kidney cancer Sister 42   Cerebral palsy Sister    Fibroids Sister    Arthritis Brother    Cancer Maternal Uncle        liver   Breast cancer Paternal Aunt 59   Heart disease Paternal Grandfather        died of MI   Hypertension Cousin    Stroke Neg Hx    Diabetes Neg Hx     The following portions of the patient's history were reviewed and updated as appropriate: allergies, current medications, past family history, past medical history, past social history, past surgical history and problem list.  ROS Otherwise as in subjective above    Objective: BP 110/70   Pulse 72   Wt 161 lb 9.6 oz (73.3 kg)   LMP 04/07/2024   SpO2 99%   BMI 28.63 kg/m   Wt Readings from Last 3 Encounters:  06/11/24 161 lb 9.6 oz (73.3 kg)  10/17/23 160 lb (72.6 kg)  10/12/23 159 lb 3.2 oz (72.2 kg)   General appearance: alert, no distress, well developed, well nourished Neck: supple, no lymphadenopathy, no thyromegaly, no masses Heart: RRR, normal S1, S2, no murmurs Lungs: CTA bilaterally, no wheezes, rhonchi, or rales Chest: She seems to be fairly tender over her right lower ribs anteriorly but not laterally and nowhere else. Back: nontender Abdomen: +bs, soft, no distinct Murphy sign, surgical scars noted from prior ports, non tender, non distended, no masses, no hepatomegaly, no splenomegaly Pulses: 2+ radial pulses, 2+ pedal pulses, normal cap refill Ext: no edema    Assessment: Encounter Diagnoses  Name Primary?   RUQ pain  Yes   Hot flashes    Night sweats    Chronic abdominal pain    Liver hemangioma    Uterine leiomyoma, unspecified location    Missed period      Plan: She had already reached out to her hematologist about the same concerns and they placed a panel of labs.  She ended up coming in our office first.  She would like a full panel labs to check for causes of the night sweats and hot flashes.  We discussed her tenderness over her right lower ribs and chest wall.  We discussed various causes differential for cough flashes and night sweats.  With her breast changes and symptoms she could be entering perimenopause.  We discussed her mammogram patient mammogram in May 2025.  We do not have a copy of that.  She says it was normal.  I reviewed the colonoscopy report from where she had a colonoscopy February 2024.  No worrisome lesions  Last Pap smear from last year reportedly up-to-date and normal per gynecology  Back in 2021 she had multiple liver cysts.  She also has history of fibroids.  She wants to move forward with imaging in light of these findings and concern    Susan Drake was seen today for acute visit.  Diagnoses and all orders for this visit:  RUQ pain -     CBC with Differential/Platelet -     Comprehensive metabolic panel with GFR -     CT ABDOMEN PELVIS W CONTRAST; Future  Hot flashes -     FSH/LH -     TSH + free T4 -     Lactate dehydrogenase -     CBC with Differential/Platelet -     Comprehensive metabolic panel with GFR -     Cancel: QuantiFERON-TB Gold Plus -     POCT urine pregnancy -     Cancel: Immature Platelet Fraction -     Folate -     Vitamin B12 -     CT ABDOMEN PELVIS W CONTRAST; Future -     QuantiFERON-TB Gold Plus; Future  Night sweats -     FSH/LH -     TSH + free T4 -     Lactate dehydrogenase -     CBC with Differential/Platelet -     Comprehensive metabolic panel with GFR -     Cancel: QuantiFERON-TB Gold Plus -     POCT urine pregnancy -  Cancel: Immature Platelet Fraction -     Folate -     Vitamin B12 -     CT ABDOMEN PELVIS W CONTRAST; Future -     QuantiFERON-TB Gold Plus; Future  Chronic abdominal pain -     POCT urine pregnancy -     CT ABDOMEN PELVIS W CONTRAST; Future  Liver hemangioma -     CT ABDOMEN PELVIS W CONTRAST; Future  Uterine leiomyoma, unspecified location -     POCT urine pregnancy -     CT ABDOMEN PELVIS W CONTRAST; Future  Missed period -     POCT urine pregnancy    Follow up: pending labs and scan

## 2024-06-11 NOTE — Telephone Encounter (Signed)
 Copied from CRM [id number]. Topic: Clinical - Request for Lab/Test Order >> Jun 11, 2024  4:19 PM Graeme ORN wrote: Reason for CRM: Jenelle called with [facility] about order received. States for what it shows provider wants checked would be a triple phase or liver protocol. Can be sent as CT abdomen with or with out or CT abdomen Pelvis with or without but needs to be updated and resent. Thank You

## 2024-06-12 ENCOUNTER — Ambulatory Visit: Payer: Self-pay | Admitting: Medical

## 2024-06-12 ENCOUNTER — Other Ambulatory Visit: Payer: Self-pay | Admitting: Medical

## 2024-06-12 DIAGNOSIS — G8929 Other chronic pain: Secondary | ICD-10-CM

## 2024-06-12 LAB — CBC WITH DIFFERENTIAL/PLATELET
Basophils Absolute: 0 x10E3/uL (ref 0.0–0.2)
Basos: 1 %
EOS (ABSOLUTE): 0 x10E3/uL (ref 0.0–0.4)
Eos: 1 %
Hematocrit: 44.6 % (ref 34.0–46.6)
Hemoglobin: 13.5 g/dL (ref 11.1–15.9)
Immature Grans (Abs): 0 x10E3/uL (ref 0.0–0.1)
Immature Granulocytes: 0 %
Lymphocytes Absolute: 1.9 x10E3/uL (ref 0.7–3.1)
Lymphs: 35 %
MCH: 25.2 pg — ABNORMAL LOW (ref 26.6–33.0)
MCHC: 30.3 g/dL — ABNORMAL LOW (ref 31.5–35.7)
MCV: 83 fL (ref 79–97)
Monocytes Absolute: 0.5 x10E3/uL (ref 0.1–0.9)
Monocytes: 8 %
Neutrophils Absolute: 2.9 x10E3/uL (ref 1.4–7.0)
Neutrophils: 55 %
Platelets: 168 x10E3/uL (ref 150–450)
RBC: 5.36 x10E6/uL — ABNORMAL HIGH (ref 3.77–5.28)
RDW: 12.5 % (ref 11.7–15.4)
WBC: 5.4 x10E3/uL (ref 3.4–10.8)

## 2024-06-12 LAB — FSH/LH
FSH: 43.9 m[IU]/mL
LH: 30.1 m[IU]/mL

## 2024-06-12 LAB — TSH+FREE T4
Free T4: 1.35 ng/dL (ref 0.82–1.77)
TSH: 1.55 u[IU]/mL (ref 0.450–4.500)

## 2024-06-12 LAB — COMPREHENSIVE METABOLIC PANEL WITH GFR
ALT: 14 IU/L (ref 0–32)
AST: 21 IU/L (ref 0–40)
Albumin: 4.9 g/dL (ref 3.9–4.9)
Alkaline Phosphatase: 50 IU/L (ref 44–121)
BUN/Creatinine Ratio: 13 (ref 9–23)
BUN: 11 mg/dL (ref 6–24)
Bilirubin Total: 0.6 mg/dL (ref 0.0–1.2)
CO2: 20 mmol/L (ref 20–29)
Calcium: 10 mg/dL (ref 8.7–10.2)
Chloride: 99 mmol/L (ref 96–106)
Creatinine, Ser: 0.85 mg/dL (ref 0.57–1.00)
Globulin, Total: 2.6 g/dL (ref 1.5–4.5)
Glucose: 79 mg/dL (ref 70–99)
Potassium: 4.2 mmol/L (ref 3.5–5.2)
Sodium: 139 mmol/L (ref 134–144)
Total Protein: 7.5 g/dL (ref 6.0–8.5)
eGFR: 87 mL/min/1.73 (ref >59)

## 2024-06-12 LAB — FOLATE: Folate: 11.8 ng/mL (ref >3.0)

## 2024-06-12 LAB — VITAMIN B12: Vitamin B-12: 978 pg/mL (ref 232–1245)

## 2024-06-12 LAB — LACTATE DEHYDROGENASE: LDH: 208 IU/L (ref 119–226)

## 2024-06-12 NOTE — Progress Notes (Signed)
 Your blood counts are okay.  LDH marker okay.  Folate okay B12 looks really good.  Thyroid normal.  Kidney liver electrolytes normal.  Still pending QuantiFERON gold tuberculosis screen  After looking at your hormone levels over the course of the last several months, I do believe you are transitioning to perimenopause.  You still potentially could get pregnant as you are having menstrual periods.  However your hormones are changing to reflect menopausal hormone levels  Thus, your symptoms probably are more related now to hormones than anything else  Often hormonal therapy or birth control will be used to help with menopausal symptoms such as hot flashes and sweats.  I recommend you follow-up with gynecology and discuss options for therapy  There are some risk the hormones.  Sometimes will use medications like Effexor or antidepressants to help with perimenopausal symptoms  Sometimes we use sleep aids and other remedies to help with sleep

## 2024-06-13 NOTE — Telephone Encounter (Signed)
 Spoke with radiology and replied to patient

## 2024-06-18 ENCOUNTER — Other Ambulatory Visit

## 2024-06-25 ENCOUNTER — Ambulatory Visit: Payer: Self-pay | Admitting: Medical

## 2024-06-25 ENCOUNTER — Inpatient Hospital Stay: Admitting: Oncology

## 2024-06-25 ENCOUNTER — Encounter: Payer: Self-pay | Admitting: Obstetrics and Gynecology

## 2024-06-25 ENCOUNTER — Telehealth: Payer: Self-pay | Admitting: Oncology

## 2024-06-25 ENCOUNTER — Ambulatory Visit
Admission: RE | Admit: 2024-06-25 | Discharge: 2024-06-25 | Disposition: A | Discharge status: Home / Self Care | Source: Ambulatory Visit | Attending: Medical | Admitting: Medical

## 2024-06-25 DIAGNOSIS — G8929 Other chronic pain: Secondary | ICD-10-CM

## 2024-06-25 MED ORDER — IOPAMIDOL (ISOVUE-300) INJECTION 61%
100.0000 mL | Freq: Once | INTRAVENOUS | Status: AC | PRN
  Administered 2024-06-25: 100 mL via INTRAVENOUS

## 2024-06-25 NOTE — Progress Notes (Signed)
 abstract

## 2024-06-25 NOTE — Progress Notes (Signed)
 Results to MyChart

## 2024-06-25 NOTE — Telephone Encounter (Signed)
 Pt no showed appt 8/25. Left vm for pt to r/s

## 2024-06-26 ENCOUNTER — Telehealth: Payer: Self-pay | Admitting: Nurse Practitioner

## 2024-06-26 NOTE — Telephone Encounter (Signed)
 Called back from vm - Pt called and said wanted to r/s missed appt from yesterday. Got appt r/s w/pt. - LH

## 2024-07-03 ENCOUNTER — Inpatient Hospital Stay: Attending: Oncology | Admitting: Oncology

## 2024-07-03 ENCOUNTER — Encounter: Payer: Self-pay | Admitting: Oncology

## 2024-07-03 VITALS — BP 117/80 | HR 68 | Temp 97.6°F | Resp 20 | Wt 163.6 lb

## 2024-07-03 DIAGNOSIS — D693 Immune thrombocytopenic purpura: Secondary | ICD-10-CM | POA: Diagnosis not present

## 2024-07-03 DIAGNOSIS — N6012 Diffuse cystic mastopathy of left breast: Secondary | ICD-10-CM | POA: Diagnosis present

## 2024-07-03 DIAGNOSIS — D696 Thrombocytopenia, unspecified: Secondary | ICD-10-CM | POA: Diagnosis not present

## 2024-07-03 DIAGNOSIS — Z807 Family history of other malignant neoplasms of lymphoid, hematopoietic and related tissues: Secondary | ICD-10-CM | POA: Diagnosis not present

## 2024-07-03 DIAGNOSIS — Z8051 Family history of malignant neoplasm of kidney: Secondary | ICD-10-CM | POA: Insufficient documentation

## 2024-07-03 DIAGNOSIS — Z800 Family history of malignant neoplasm of digestive organs: Secondary | ICD-10-CM | POA: Insufficient documentation

## 2024-07-03 DIAGNOSIS — N6092 Unspecified benign mammary dysplasia of left breast: Secondary | ICD-10-CM | POA: Diagnosis not present

## 2024-07-03 NOTE — Progress Notes (Signed)
 Hematology/Oncology Progress note [phone] [phone]     CHIEF COMPLAINTS/REASON FOR VISIT:  Thrombocytopenia  ASSESSMENT & PLAN:   Atypical ductal hyperplasia of left breast Discussed with patient that ADH is a high risk precancer lesion.  Recommend continuation of mammogram annually, discussed about chemoprevention with tamoxifen.  Patient declined chemoprevention and continue active surveillance. Recent mammogram 04/28/24 negative.     Thrombocytopenia (HCC) History of ITP Labs reviewed and discussed with patient Platelet count is stable. Vitamin B12 level has improved.   Recommend patient to continue  B12 supplementation weekly.   Orders Placed This Encounter  Procedures   CBC with Differential (Cancer Center Only)    Standing Status:   Future    Expected Date:   07/03/2025    Expiration Date:   10/01/2025   CMP (Cancer Center only)    Standing Status:   Future    Expected Date:   07/03/2025    Expiration Date:   10/01/2025   Vitamin B12    Standing Status:   Future    Expected Date:   07/03/2025    Expiration Date:   10/01/2025   Folate    Standing Status:   Future    Expected Date:   07/03/2025    Expiration Date:   10/01/2025   Follow up in 1 year All questions were answered. The patient knows to call the clinic with any problems, questions or concerns.  Zelphia Cap, MD, PhD [facility] Hematology Oncology 07/03/2024    HISTORY OF PRESENTING ILLNESS:  Susan Drake is a 43 y.o. female who was seen in consultation at the request of Tysinger, Alm RAMAN, PA-C for evaluation of thrombocytopenia   Reviewed patient's labs done previously.  08/21/2021 labs showed decreased platelet counts at 136,000. Normal wbc normal hemoglobin  Reviewed patient's previous labs. Thrombocytopenia is intermittent, chronic onset , since at least 2013 No aggravating or elevated factors.  Associated symptoms or signs:  Denies weight loss, fever, chills, fatigue, night  sweats.  Denies hematochezia, hematuria, hematemesis, epistaxis, black tarry stool. + easy bruising on her bilateral lower extremities..   Patient drinks alcohol very occasionally. Patient denies any bleeding complications of the previous myomectomy.  Patient has had wisdom teeth extraction and she recalls that she had some prolonged bleeding after the procedure.  01/26/2022, mammogram left diagnostic showed indeterminate left breast calcification.  Recommend stereo tactic biopsy 02/10/2022, stereotactic biopsy showed focal atypical ductal hyperplasia.  Fibrocystic changes with calcifications. 04/01/2022, left breast lumpectomy showed benign breast tissue with fibrocystic changes, coronary surgeon, fibroadenomatoid change, apocrine metaplasia and focal usual ductal hyperplasia.  Negative for atypia/malignancy in sections examined.   INTERVAL HISTORY Susan Drake is a 43 y.o. female who has above history reviewed by me today presents for follow up visit for thrombocytopenia, recent diagnosis of atypical ductal hyperplasia. The patient reports feeling well.  No concerns at the surgical sites.  No bleeding events.  Easy bruising has improved.   She takes vitamin B12 supplementation  several times per week.    Review of Systems  Constitutional:  Negative for appetite change, chills, fatigue and fever.  HENT:   Negative for hearing loss and voice change.   Eyes:  Negative for eye problems.  Respiratory:  Negative for chest tightness and cough.   Cardiovascular:  Negative for chest pain.  Gastrointestinal:  Negative for abdominal distention, abdominal pain and blood in stool.  Endocrine: Negative for hot flashes.  Genitourinary:  Negative for difficulty urinating and frequency.  Musculoskeletal:  Negative for arthralgias.  Skin:  Negative for itching and rash.  Neurological:  Negative for extremity weakness.  Hematological:  Negative for adenopathy. Does not bruise/bleed easily.   Psychiatric/Behavioral:  Negative for confusion.     MEDICAL HISTORY:  Past Medical History:  Diagnosis Date   Anxiety    Constipation    sees Dr. Renaye Sous   Dermatological disease    sees Dr. Ivin, bilat upper arms and chest   Diverticulosis    Mild case   Former smoker    quit 08/2021   History of chlamydia    hx/o trichomonas and chlamydia in remote past   Liver cyst    Seasonal allergic rhinitis    Uterine fibroid     SURGICAL HISTORY: Past Surgical History:  Procedure Laterality Date   BREAST BIOPSY Left    BREAST LUMPECTOMY WITH RADIOACTIVE SEED LOCALIZATION Left 04/01/2022   Procedure: LEFT BREAST LUMPECTOMY WITH RADIOACTIVE SEED LOCALIZATION;  Surgeon: Belinda Cough, MD;  Location: [facility];  Service: General;  Laterality: Left;   COLONOSCOPY  12/2022   [facility], diverticulosis, repeat 10 years   MYOMECTOMY  05/03/2012   Procedure: MYOMECTOMY;  Surgeon: Dickie DELENA Carder, MD;  Location: [facility];  Service: Gynecology;  Laterality: N/A;   Exploratory Laparotomy myomectomy. Open at 1410.   MYOMECTOMY  06/21/2019   Exploratory Laparotomy MYOMECTOMY (N/A Abdomen)   MYOMECTOMY N/A 06/21/2019   Procedure: Exploratory Laparotomy MYOMECTOMY;  Surgeon: Carder Dickie, MD;  Location: [facility];  Service: Gynecology;  Laterality: N/A;   ROBOT ASSISTED MYOMECTOMY  05/03/2012   Procedure: ROBOTIC ASSISTED MYOMECTOMY;  Surgeon: Dickie DELENA Carder, MD;  Location: [facility];  Service: Gynecology;  Laterality: N/A;  Times two fibroids with robot.   WISDOM TOOTH EXTRACTION  2011    SOCIAL HISTORY: Social History   Socioeconomic History   Marital status: Married    Spouse name: Not on file   Number of children: Not on file   Years of education: Not on file   Highest education level: Not on file  Occupational History   Not on file  Tobacco Use   Smoking status: Former    Types: Cigars   Smokeless tobacco: Never   Tobacco comments:    cigars  occaionally  Vaping Use   Vaping status: Never Used  Substance and Sexual Activity   Alcohol use: Yes    Comment: socially   Drug use: No   Sexual activity: Not on file  Other Topics Concern   Not on file  Social History Narrative   Married, no children, exercise - some with treadmill, cardio, goes to gym, works at [employer].  10/2023   Social Drivers of Health   Financial Resource Strain: Medium Risk (10/12/2023)   Overall Financial Resource Strain (CARDIA)    Difficulty of Paying Living Expenses: Somewhat hard  Food Insecurity: No Food Insecurity (10/12/2023)   Hunger Vital Sign    Worried About Running Out of Food in the Last Year: Never true    Ran Out of Food in the Last Year: Never true  Transportation Needs: No Transportation Needs (10/12/2023)   PRAPARE - Administrator, Civil Service (Medical): No    Lack of Transportation (Non-Medical): No  Physical Activity: Not on file  Stress: Stress Concern Present (10/12/2023)   [employer] of Occupational Health - Occupational Stress Questionnaire    Feeling of Stress : To some extent  Social Connections: Unknown (10/12/2023)   Social  Connection and Isolation Panel    Frequency of Communication with Friends and Family: Twice a week    Frequency of Social Gatherings with Friends and Family: Not on file    Attends Religious Services: 1 to 4 times per year    Active Member of [employer] or Organizations: No    Attends Engineer, structural: 1 to 4 times per year    Marital Status: Married  Catering manager Violence: Not At Risk (10/12/2023)   Humiliation, Afraid, Rape, and Kick questionnaire    Fear of Current or Ex-Partner: No    Emotionally Abused: No    Physically Abused: No    Sexually Abused: No    FAMILY HISTORY: Family History  Problem Relation Age of Onset   COPD Mother    Appendicitis Mother    Non-Hodgkin's lymphoma Mother 72   Aortic aneurysm Mother    AAA (abdominal aortic  aneurysm) Mother    COPD Father        emphysema   Hypertension Father    Eczema Father    Kidney disease Sister    Kidney cancer Sister 52   Cerebral palsy Sister    Fibroids Sister    Arthritis Brother    Cancer Maternal Uncle        liver   Breast cancer Paternal Aunt 69   Heart disease Paternal Grandfather        died of MI   Hypertension Cousin    Stroke Neg Hx    Diabetes Neg Hx     ALLERGIES:  is allergic to other.  MEDICATIONS:  Current Outpatient Medications  Medication Sig Dispense Refill   fluticasone  (FLONASE ) 50 MCG/ACT nasal spray SPRAY 2 SPRAYS INTO EACH NOSTRIL EVERY DAY 16 mL 3   Probiotic Product (PROBIOTIC DAILY PO) Take 1 capsule by mouth daily.      vitamin B-12 (CYANOCOBALAMIN ) 1000 MCG tablet Take 1,000 mcg by mouth daily.     Vitamin D , Ergocalciferol , (DRISDOL ) 1.25 MG (50000 UNIT) CAPS capsule Take 1 capsule (50,000 Units total) by mouth every 7 (seven) days. 12 capsule 3   No current facility-administered medications for this visit.     PHYSICAL EXAMINATION: ECOG PERFORMANCE STATUS: 0 - Asymptomatic Vitals:   07/03/24 1428  BP: 117/80  Pulse: 68  Resp: 20  Temp: 97.6 F (36.4 C)  SpO2: 100%   Filed Weights   07/03/24 1428  Weight: 163 lb 9.6 oz (74.2 kg)     Physical Exam Constitutional:      General: She is not in acute distress. HENT:     Head: Normocephalic and atraumatic.  Eyes:     General: No scleral icterus. Cardiovascular:     Rate and Rhythm: Normal rate.  Pulmonary:     Effort: Pulmonary effort is normal. No respiratory distress.  Abdominal:     General: There is no distension.  Musculoskeletal:        General: No deformity. Normal range of motion.     Cervical back: Normal range of motion and neck supple.  Skin:    Findings: No rash.  Neurological:     Mental Status: She is alert and oriented to person, place, and time. Mental status is at baseline.     Cranial Nerves: No cranial nerve deficit.  Psychiatric:         Mood and Affect: Mood normal.    Breast exam was performed in seated and lying down position. Left breast 2 o'clock, fibroglandular changes.  No  palpable breast masses bilaterally.  No palpable axillary adenopathy bilaterally.   LABORATORY DATA:  I have reviewed the data as listed     Latest Ref Rng & Units 06/11/2024   11:30 AM 04/20/2023    1:36 PM 02/11/2023    3:02 PM  CBC  WBC 3.4 - 10.8 x10E3/uL 5.4  7.6  5.1   Hemoglobin 11.1 - 15.9 g/dL 86.4  86.8  86.1   Hematocrit 34.0 - 46.6 % 44.6  41.3  43.5   Platelets 150 - 450 x10E3/uL 168  162  184       Latest Ref Rng & Units 06/11/2024   11:30 AM 10/12/2023   10:42 AM 04/20/2023    1:36 PM  CMP  Glucose 70 - 99 mg/dL 79  84  85   BUN 6 - 24 mg/dL 11  9  18    Creatinine 0.57 - 1.00 mg/dL 9.14  9.15  9.02   Sodium 134 - 144 mmol/L 139  140  134   Potassium 3.5 - 5.2 mmol/L 4.2  4.4  4.0   Chloride 96 - 106 mmol/L 99  103  100   CO2 20 - 29 mmol/L 20  25  24    Calcium 8.7 - 10.2 mg/dL 89.9  9.5  8.8   Total Protein 6.0 - 8.5 g/dL 7.5   7.5   Total Bilirubin 0.0 - 1.2 mg/dL 0.6   0.8   Alkaline Phos 44 - 121 IU/L 50   31   AST 0 - 40 IU/L 21   20   ALT 0 - 32 IU/L 14   19     08/21/2021 HIV negative, hepatitis C negative   RADIOGRAPHIC STUDIES: I have personally reviewed the radiological images as listed and agreed with the findings in the report.  CT ABDOMEN PELVIS W CONTRAST Result Date: 06/25/2024 CLINICAL DATA:  Chronic abdominal pain EXAM: CT ABDOMEN AND PELVIS WITH CONTRAST TECHNIQUE: Multidetector CT imaging of the abdomen and pelvis was performed using the standard protocol following bolus administration of intravenous contrast. RADIATION DOSE REDUCTION: This exam was performed according to the departmental dose-optimization program which includes automated exposure control, adjustment of the mA and/or kV according to patient size and/or use of iterative reconstruction technique. CONTRAST:  ISOVUE -300  IOPAMIDOL  (ISOVUE -300) INJECTION 61% COMPARISON:  MRI of the abdomen 08/12/2020 FINDINGS: Lower chest: No acute abnormality. Hepatobiliary: Normal hepatic contour and morphology. 4.1 x 3.6 cm low-attenuation lesion centrally within hepatic segment 6/5 demonstrates peripheral discontinuous nodular enhancement. This lesion was previously confirmed as a benign hemangioma on MRI. Multiple additional circumscribed low-attenuation cystic lesions are scattered throughout the liver. These were also visualized on the prior MRI and confirmed as probable benign cysts. No new or solid lesion is identified. Gallbladder is unremarkable. No intra or extrahepatic biliary ductal dilatation. Pancreas: Unremarkable. No pancreatic ductal dilatation or surrounding inflammatory changes. Spleen: Normal in size without focal abnormality. Adrenals/Urinary Tract: Adrenal glands are unremarkable. No hydronephrosis, nephrolithiasis or enhancing renal mass. At least partially duplicated right renal collecting system. Bladder is unremarkable. Stomach/Bowel: No evidence of obstruction or focal bowel wall thickening. The appendix is visualized and may be surgically absent. The terminal ileum is unremarkable. Vascular/Lymphatic: Normal vascular structures. No plaque, aneurysm or other abnormality. No suspicious lymphadenopathy. Reproductive: Multi fibroid uterus. Normal right ovary. Ovoid simple cyst in the left ovary measures up to 5.3 cm. This is almost certainly a benign follicular cyst. No imaging follow-up is recommended. Other: No acute fracture or aggressive  appearing lytic or blastic osseous lesion. Musculoskeletal: No acute fracture or aggressive appearing lytic or blastic osseous lesion. IMPRESSION: 1. No acute abnormality within abdomen pelvis. 2. Multi fibroid uterus. 3. Benign hepatic hemangioma and multiple hepatic cysts as seen on prior MRI abdomen. 4. 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  This is unlikely to be of clinical significance. 5. Additional ancillary findings as above. Electronically Signed   By: Wilkie Lent M.D.   On: 06/25/2024 14:48

## 2024-07-03 NOTE — Assessment & Plan Note (Addendum)
 History of ITP Labs reviewed and discussed with patient Platelet count is stable. Vitamin B12 level has improved.   Recommend patient to continue  B12 supplementation weekly.

## 2024-07-03 NOTE — Assessment & Plan Note (Addendum)
 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  Recommend continuation of mammogram annually, discussed about chemoprevention with tamoxifen.  Patient declined chemoprevention and continue active surveillance. Recent mammogram 04/28/24 negative.

## 2024-08-10 ENCOUNTER — Telehealth: Payer: Self-pay | Admitting: *Deleted

## 2024-08-10 NOTE — Telephone Encounter (Signed)
 Spoke with the patient regarding her referral to gyn onc. Patient requested appt at [facility] since she sees Dr Babara there. Records faxed

## 2024-08-15 ENCOUNTER — Inpatient Hospital Stay: Attending: Oncology | Admitting: Obstetrics and Gynecology

## 2024-08-15 ENCOUNTER — Inpatient Hospital Stay

## 2024-08-15 VITALS — BP 123/74 | HR 76 | Temp 97.4°F | Resp 16 | Wt 167.0 lb

## 2024-08-15 DIAGNOSIS — Z8051 Family history of malignant neoplasm of kidney: Secondary | ICD-10-CM | POA: Diagnosis not present

## 2024-08-15 DIAGNOSIS — Z8619 Personal history of other infectious and parasitic diseases: Secondary | ICD-10-CM | POA: Insufficient documentation

## 2024-08-15 DIAGNOSIS — R971 Elevated cancer antigen 125 [CA 125]: Secondary | ICD-10-CM | POA: Insufficient documentation

## 2024-08-15 DIAGNOSIS — D259 Leiomyoma of uterus, unspecified: Secondary | ICD-10-CM | POA: Diagnosis not present

## 2024-08-15 DIAGNOSIS — Z87891 Personal history of nicotine dependence: Secondary | ICD-10-CM | POA: Diagnosis not present

## 2024-08-15 DIAGNOSIS — K5900 Constipation, unspecified: Secondary | ICD-10-CM | POA: Insufficient documentation

## 2024-08-15 DIAGNOSIS — J309 Allergic rhinitis, unspecified: Secondary | ICD-10-CM | POA: Insufficient documentation

## 2024-08-15 DIAGNOSIS — Q638 Other specified congenital malformations of kidney: Secondary | ICD-10-CM | POA: Diagnosis not present

## 2024-08-15 DIAGNOSIS — N6092 Unspecified benign mammary dysplasia of left breast: Secondary | ICD-10-CM | POA: Diagnosis not present

## 2024-08-15 DIAGNOSIS — E559 Vitamin D deficiency, unspecified: Secondary | ICD-10-CM | POA: Diagnosis not present

## 2024-08-15 DIAGNOSIS — N83201 Unspecified ovarian cyst, right side: Secondary | ICD-10-CM | POA: Insufficient documentation

## 2024-08-15 DIAGNOSIS — F419 Anxiety disorder, unspecified: Secondary | ICD-10-CM | POA: Insufficient documentation

## 2024-08-15 DIAGNOSIS — Z803 Family history of malignant neoplasm of breast: Secondary | ICD-10-CM | POA: Diagnosis not present

## 2024-08-15 DIAGNOSIS — N83209 Unspecified ovarian cyst, unspecified side: Secondary | ICD-10-CM

## 2024-08-15 DIAGNOSIS — Z807 Family history of other malignant neoplasms of lymphoid, hematopoietic and related tissues: Secondary | ICD-10-CM | POA: Insufficient documentation

## 2024-08-15 DIAGNOSIS — Z808 Family history of malignant neoplasm of other organs or systems: Secondary | ICD-10-CM | POA: Insufficient documentation

## 2024-08-15 NOTE — Progress Notes (Signed)
 Gynecologic Oncology Consult Visit   Referring Provider: Dr Truman Corona  Chief Concern: Elevated CA125, ovarian cyst and fibroids  Subjective:  Susan Drake is a 43 y.o. P0 female who is seen in consultation from Dr. Corona for ovarian cyst and fibroids with slightly elevated CA125.  CT scan 06/25/24 for RUQ abdominal pain. FINDINGS: Lower chest: No acute abnormality.   Hepatobiliary: Normal hepatic contour and morphology. 4.1 x 3.6 cm low-attenuation lesion centrally within hepatic segment 6/5 demonstrates peripheral discontinuous nodular enhancement. This lesion was previously confirmed as a benign hemangioma on MRI. Multiple additional circumscribed low-attenuation cystic lesions are scattered throughout the liver. These were also visualized on the prior MRI and confirmed as probable benign cysts. No new or solid lesion is identified. Gallbladder is unremarkable. No intra or extrahepatic biliary ductal dilatation.   Pancreas: Unremarkable. No pancreatic ductal dilatation or surrounding inflammatory changes.   Spleen: Normal in size without focal abnormality.   Adrenals/Urinary Tract: Adrenal glands are unremarkable. No hydronephrosis, nephrolithiasis or enhancing renal mass. At least partially duplicated right renal collecting system. Bladder is unremarkable.   Stomach/Bowel: No evidence of obstruction or focal bowel wall thickening. The appendix is visualized and may be surgically absent. The terminal ileum is unremarkable.   Vascular/Lymphatic: Normal vascular structures. No plaque, aneurysm or other abnormality. No suspicious lymphadenopathy.   Reproductive: Multi fibroid uterus. Normal right ovary. Ovoid simple cyst in the left ovary measures up to 5.3 cm. This is almost certainly a benign follicular cyst. No imaging follow-up is recommended.   Other: No acute fracture or aggressive appearing lytic or blastic osseous lesion.   Musculoskeletal: No acute  fracture or aggressive appearing lytic or blastic osseous lesion.   IMPRESSION: 1. No acute abnormality within abdomen pelvis. 2. Multi fibroid uterus. 3. Benign hepatic hemangioma and multiple hepatic cysts as seen on prior MRI abdomen. 4. 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 This is unlikely to be of clinical significance. 5. Additional ancillary findings as above.   Seen for annual exam 07/09/24 by Dr German.  LMP in June and has some hot flashes.  FSH 11.  07/09/24 CA125 = 11.5, PAP and HPV negative. Does not take any hormones. Myomectomy 2013 and 2020.  She is married and was pursuing fertility until the last few years, but no longer is interested in trying to have children.   On office US  08/06/24 patient reported to have 16 week fibroid uterus with multiple fibroids.  And 5.8 x 3.0 cm simple left ovarian cyst. She had 2.5 cm ovarian cyst in 2021, but none in 2023 US .  08/06/24 CA125 66.9  She was referred due to elevated CA125 and ovarian cyst.  Dr German suggested progestin therapy in view on her amenorrhea.     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   Screening for diabetes mellitus 08/23/2022   Hair thinning 08/23/2022   It band syndrome, right 08/23/2022   Screening for lipid disorders 08/23/2022   Genetic testing 05/26/2022   Thrombocytopenia 09/16/2021   Screen for STD (sexually transmitted disease) 08/21/2021   Encounter for hepatitis C screening test for low risk patient 08/21/2021   Rectal abnormality 07/01/2021   Decreased rectal sphincter  tone 07/01/2021   Liver cyst 07/01/2021   Liver hemangioma 07/01/2021   Diverticulosis 07/01/2021   Chronic abdominal  pain 04/04/2020   Cyst of right ovary 04/04/2020   Pelvic pain 04/04/2020   H/O abdominal surgery 04/04/2020   Uterine leiomyoma 04/04/2020   Loose stools 04/04/2020   Dyspareunia, female 04/04/2020   Rash 09/12/2019   Bruising 09/12/2019   Pruritus 09/12/2019   Anemia 09/12/2019   Iron  deficiency 09/12/2019   Uterine fibroid 06/21/2019   S/P myomectomy 06/21/2019   Need for influenza vaccination 09/27/2018   Vaccine counseling 09/27/2018   Encounter for health maintenance examination in adult 09/27/2018   Chronic constipation 01/10/2017   Allergic rhinitis 01/10/2017   Vitamin D  deficiency 01/10/2017   Fatigue 01/10/2017    Past Medical History: Past Medical History:  Diagnosis Date   Anxiety    Constipation    sees Dr. Renaye Sous   Dermatological disease    sees Dr. Ivin, bilat upper arms and chest   Diverticulosis    Mild case   Former smoker    quit 08/2021   History of chlamydia    hx/o trichomonas and chlamydia in remote past   Liver cyst    Seasonal allergic rhinitis    Uterine fibroid     Past Surgical History: Past Surgical History:  Procedure Laterality Date   BREAST BIOPSY Left    BREAST LUMPECTOMY WITH RADIOACTIVE SEED LOCALIZATION Left 04/01/2022   Procedure: LEFT BREAST LUMPECTOMY WITH RADIOACTIVE SEED LOCALIZATION;  Surgeon: Belinda Cough, MD;  Location: [facility];  Service: General;  Laterality: Left;   COLONOSCOPY  12/2022   [facility], diverticulosis, repeat 10 years   MYOMECTOMY  05/03/2012   Procedure: MYOMECTOMY;  Surgeon: Dickie DELENA Carder, MD;  Location: [facility];  Service: Gynecology;  Laterality: N/A;   Exploratory Laparotomy myomectomy. Open at 1410.   MYOMECTOMY  06/21/2019   Exploratory Laparotomy MYOMECTOMY (N/A Abdomen)   MYOMECTOMY N/A 06/21/2019   Procedure: Exploratory Laparotomy MYOMECTOMY;  Surgeon: Carder Dickie, MD;  Location: [facility];  Service: Gynecology;  Laterality: N/A;   ROBOT ASSISTED MYOMECTOMY   05/03/2012   Procedure: ROBOTIC ASSISTED MYOMECTOMY;  Surgeon: Dickie DELENA Carder, MD;  Location: [facility];  Service: Gynecology;  Laterality: N/A;  Times two fibroids with robot.   WISDOM TOOTH EXTRACTION  2011      OB History:  OB History  No obstetric history on file.    Family History: Family History  Problem Relation Age of Onset   COPD Mother    Appendicitis Mother    Non-Hodgkin's lymphoma Mother 19   Aortic aneurysm Mother    AAA (abdominal aortic aneurysm) Mother    COPD Father        emphysema   Hypertension Father    Eczema Father    Kidney disease Sister    Kidney cancer Sister 80   Cerebral palsy Sister    Fibroids Sister    Arthritis Brother    Cancer Maternal Uncle        liver   Breast cancer Paternal Aunt 44   Heart disease Paternal Grandfather        died of MI   Hypertension Cousin    Stroke Neg Hx    Diabetes Neg Hx     Social History: Social History   Socioeconomic History   Marital status: Married    Spouse name: Not on file   Number of children: Not on file   Years of education: Not  on file   Highest education level: Not on file  Occupational History   Not on file  Tobacco Use   Smoking status: Former    Types: Cigars   Smokeless tobacco: Never   Tobacco comments:    cigars occaionally  Vaping Use   Vaping status: Never Used  Substance and Sexual Activity   Alcohol use: Yes    Comment: socially   Drug use: No   Sexual activity: Not on file  Other Topics Concern   Not on file  Social History Narrative   Married, no children, exercise - some with treadmill, cardio, goes to gym, works at [employer].  10/2023   Social Drivers of Health   Financial Resource Strain: Medium Risk (10/12/2023)   Overall Financial Resource Strain (CARDIA)    Difficulty of Paying Living Expenses: Somewhat hard  Food Insecurity: No Food Insecurity (10/12/2023)   Hunger Vital Sign    Worried About Running Out of Food in the Last Year: Never true     Ran Out of Food in the Last Year: Never true  Transportation Needs: No Transportation Needs (10/12/2023)   PRAPARE - Administrator, Civil Service (Medical): No    Lack of Transportation (Non-Medical): No  Physical Activity: Not on file  Stress: Stress Concern Present (10/12/2023)   [employer] of Occupational Health - Occupational Stress Questionnaire    Feeling of Stress : To some extent  Social Connections: Unknown (10/12/2023)   Social Connection and Isolation Panel    Frequency of Communication with Friends and Family: Twice a week    Frequency of Social Gatherings with Friends and Family: Not on file    Attends Religious Services: 1 to 4 times per year    Active Member of [employer] or Organizations: No    Attends Banker Meetings: 1 to 4 times per year    Marital Status: Married  Catering manager Violence: Not At Risk (10/12/2023)   Humiliation, Afraid, Rape, and Kick questionnaire    Fear of Current or Ex-Partner: No    Emotionally Abused: No    Physically Abused: No    Sexually Abused: No    Allergies: Allergies  Allergen Reactions   Other Hives    Dermabond    Current Medications: Current Outpatient Medications  Medication Sig Dispense Refill   fluticasone  (FLONASE ) 50 MCG/ACT nasal spray SPRAY 2 SPRAYS INTO EACH NOSTRIL EVERY DAY 16 mL 3   Probiotic Product (PROBIOTIC DAILY PO) Take 1 capsule by mouth daily.      vitamin B-12 (CYANOCOBALAMIN ) 1000 MCG tablet Take 1,000 mcg by mouth daily.     Vitamin D , Ergocalciferol , (DRISDOL ) 1.25 MG (50000 UNIT) CAPS capsule Take 1 capsule (50,000 Units total) by mouth every 7 (seven) days. 12 capsule 3   No current facility-administered medications for this visit.    Review of Systems General: negative for, fevers, chills, fatigue, changes in sleep, changes in weight or appetite Skin: negative for changes in color, texture, moles or lesions Eyes: negative for, changes in vision, pain,  diplopia HEENT: negative for, change in hearing, pain, discharge, tinnitus, vertigo, voice changes, sore throat, neck masses Breasts: negative for breast lumps Pulmonary: negative for, dyspnea, orthopnea, productive cough Cardiac: negative for, palpitations, syncope, pain, discomfort, pressure Gastrointestinal: negative for, dysphagia, nausea, vomiting, jaundice, pain, constipation, diarrhea, hematemesis, hematochezia Genitourinary/Sexual: negative for, dysuria, discharge, hesitancy, nocturia, retention, stones, infections, STD's, incontinence Ob/Gyn: negative for, irregular bleeding, pain Musculoskeletal: negative for, pain, stiffness, swelling, range of  motion limitation Hematology: negative for, easy bruising, bleeding Neurologic/Psych: negative for, headaches, seizures, paralysis, weakness, tremor, change in gait, change in sensation, mood swings, depression, anxiety, change in memory  Objective:  Physical Examination:  There were no vitals taken for this visit.   ECOG Performance Status: 0 - Asymptomatic  General appearance: alert, cooperative, and appears stated age HEENT:PERRLA and thyroid without masses Lymph node survey: non-palpable, axillary, inguinal, supraclavicular Cardiovascular: regular rate and rhythm, no murmurs or gallops Respiratory: normal air entry, lungs clear to auscultation Abdomen: soft, non-tender, without masses or organomegaly, no hernias, and well healed incision Back: inspection of back is normal Extremities: extremities normal, atraumatic, no cyanosis or edema Skin exam - normal coloration and turgor, no rashes, no suspicious skin lesions noted. Neurological exam reveals alert, oriented, normal speech, no focal findings or movement disorder noted.  Pelvic: exam chaperoned by nurse;  Vulva: normal appearing vulva with no masses, tenderness or lesions; Vagina: normal vagina; Adnexa: normal adnexa in size, nontender and no masses; Uterus: enlarged to about 12  weeks size anteriorly; Cervix: no lesions; Rectal: not indicated        Assessment:  Susan Drake is a 43 y.o. P0 female diagnosed with fibroid uterus and left adnexal cyst seen on CT scan for RUQ abdominal pain 8/25.  The pain has resolved.  CT also showed liver cysts.  She was seen by Gyn and has slightly elevated CA125 (66). She has not had a period since June and has some hot flashes but FSH still premenopausal.  Suspect she is perimenopausal.  Pelvic US  showed multiple fibroids, 12 wk size by my exam, and 5 cm simple cyst in left adnexa.  The ovarian cyst could be a hydrosalpinx as it is oblong. Most likely the slightly elevated CA125 is due to fibroids and benign ovarian cyst.  Do not think that ovarian malignancy is likely.     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  Plan:   Problem List Items Addressed This Visit   None Visit Diagnoses       Cyst of ovary, unspecified laterality    -  Primary   Relevant Orders   US  PELVIC COMPLETE WITH TRANSVAGINAL   CA 125      Suggest repeat imaging with pelvic US  and CA125 in 3 months and RTC at that time.  She is not pursuing fertility and is not symptomatic in relation to the cyst or fibroids.  So do not think surgery will be needed.    She is probably perimenopausal.  Dr German had suggested a progestin to elicit menses, but do not think this is necessary because she is not having any uterine bleeding.       The patient's diagnosis, an outline of the further diagnostic and laboratory studies which will be required, the recommendation, and alternatives were discussed.  All questions were answered to the patient's satisfaction.  A total of 40 minutes were spent with the patient/family today; 40% was spent in education, counseling and coordination of care for fibroids, ovarian cyst and elevated CA125.    Prentice Agent, MD  CC:  Latisha Medford, MD [address] [place], [address] [place],  [place]  [phone]

## 2024-08-22 ENCOUNTER — Inpatient Hospital Stay

## 2024-10-12 ENCOUNTER — Inpatient Hospital Stay: Attending: Oncology

## 2024-10-12 DIAGNOSIS — N83291 Other ovarian cyst, right side: Secondary | ICD-10-CM | POA: Insufficient documentation

## 2024-10-12 DIAGNOSIS — N83209 Unspecified ovarian cyst, unspecified side: Secondary | ICD-10-CM

## 2024-10-13 LAB — CA 125: Cancer Antigen (CA) 125: 4.7 U/mL (ref 0.0–38.1)

## 2024-10-15 ENCOUNTER — Encounter: Payer: 59 | Admitting: Medical

## 2024-10-15 ENCOUNTER — Ambulatory Visit

## 2024-10-15 ENCOUNTER — Ambulatory Visit: Admission: RE | Admit: 2024-10-15 | Discharge: 2024-10-15 | Attending: Nurse Practitioner

## 2024-10-15 DIAGNOSIS — N83209 Unspecified ovarian cyst, unspecified side: Secondary | ICD-10-CM | POA: Insufficient documentation

## 2024-10-17 ENCOUNTER — Inpatient Hospital Stay (HOSPITAL_BASED_OUTPATIENT_CLINIC_OR_DEPARTMENT_OTHER): Admitting: Obstetrics and Gynecology

## 2024-10-17 VITALS — BP 97/71 | HR 82 | Temp 98.1°F | Ht 63.0 in | Wt 165.0 lb

## 2024-10-17 DIAGNOSIS — N83209 Unspecified ovarian cyst, unspecified side: Secondary | ICD-10-CM

## 2024-10-17 DIAGNOSIS — N83291 Other ovarian cyst, right side: Secondary | ICD-10-CM

## 2024-10-17 NOTE — Progress Notes (Signed)
 Gynecologic Oncology Consult Visit   Referring Provider: Dr Truman Corona  Chief Concern: Elevated CA125, ovarian cyst and fibroids  Subjective:  Susan Drake is a 43 y.o. P0 female who is seen in consultation from Dr. Bulah for ovarian cyst and fibroids with slightly elevated CA125.  CA125 was 4.7 on 10/12/24. US  stable and not concerning.   10/15/24- US  Pelvic Complete with Transvaginal FINDINGS: Uterus  - Measurements: 9.1 x 7.4 x 9.1 cm = volume: 323 mL. Innumerable myometrial fibroids. The largest fibroid in the anterior and left side myometrium measures 4.3 x 3.5 x 3.6 cm and the next largest fibroid is in the posterior and right-sided myometrium and measures 3.3 x 3.1 x 2.7 cm.  - Endometrium  Thickness: 4.0 mm.  No focal abnormality visualized.  - Right ovary   Measurements: 1.6 x 1.0 x 1.7 cm = volume: 1.5 mL. Normal appearance/no adnexal mass.  - Left ovary    Measurements: 6.3 x 4.2 x 2.8 cm = volume: 39.0 mL. Simple cyst measures 5.6 x 3.7 x 4.0 cm.  - Other findings   No abnormal free fluid.   IMPRESSION: 1. Fibroid uterus. 2. 5.6 cm left ovarian probable benign cyst. Recommend follow-up US  in 3-6 months. Note: This recommendation does not apply to premenarchal patients or to those with increased risk (genetic, family history, elevated tumor markers or other high-risk factors) of ovarian cancer. Reference: Radiology 2019 Nov; 293(2):359-371.   Gynecologic History:   CT scan 06/25/24 for RUQ abdominal pain. FINDINGS: Lower chest: No acute abnormality.   Hepatobiliary: Normal hepatic contour and morphology. 4.1 x 3.6 cm low-attenuation lesion centrally within hepatic segment 6/5 demonstrates peripheral discontinuous nodular enhancement. This lesion was previously confirmed as a benign hemangioma on MRI. Multiple additional circumscribed low-attenuation cystic lesions are scattered throughout the liver. These were also visualized on the prior MRI and confirmed as  probable benign cysts. No new  or solid lesion is identified. Gallbladder is unremarkable. No intra or extrahepatic biliary ductal dilatation.   Pancreas: Unremarkable. No pancreatic ductal dilatation or surrounding inflammatory changes.   Spleen: Normal in size without focal abnormality.   Adrenals/Urinary Tract: Adrenal glands are unremarkable. No hydronephrosis, nephrolithiasis or enhancing renal mass. At least partially duplicated right renal collecting system. Bladder is unremarkable.   Stomach/Bowel: No evidence of obstruction or focal bowel wall thickening. The appendix is visualized and may be surgically absent. The terminal ileum is unremarkable.   Vascular/Lymphatic: Normal vascular structures. No plaque, aneurysm or other abnormality. No suspicious lymphadenopathy.   Reproductive: Multi fibroid uterus. Normal right ovary. Ovoid simple cyst in the left ovary measures up to 5.3 cm. This is almost certainly a benign follicular cyst. No imaging follow-up is recommended.   Other: No acute fracture or aggressive appearing lytic or blastic osseous lesion.   Musculoskeletal: No acute fracture or aggressive appearing lytic or blastic osseous lesion.   IMPRESSION: 1. No acute abnormality within abdomen pelvis. 2. Multi fibroid uterus. 3. Benign hepatic hemangioma and multiple hepatic cysts as seen on prior MRI abdomen. 4. 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 This is unlikely to be of clinical significance. 5. Additional ancillary findings as above.  Seen for annual exam 07/09/24 by Dr German.  LMP in June and has some hot flashes.  FSH 11.  07/09/24 CA125 = 11.5, PAP and HPV negative. Does not take any hormones. Myomectomy 2013 and 2020.  She is married and was pursuing fertility until the last few years, but  no longer is interested in trying to have children.   On office US  08/06/24 patient reported to have 16 week fibroid uterus with multiple  fibroids.  And 5.8 x 3.0 cm simple left ovarian cyst. She had 2.5 cm ovarian cyst in 2021, but none in 2023 US .   08/06/24 CA125 66.9   She was referred due to elevated CA125 and ovarian cyst. Dr German suggested progestin therapy in view on her amenorrhea.   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   Rectal abnormality 07/01/2021   Decreased rectal sphincter tone 07/01/2021   Liver cyst 07/01/2021   Liver hemangioma 07/01/2021   Diverticulosis 07/01/2021   Chronic abdominal pain 04/04/2020   Cyst of right ovary 04/04/2020   Pelvic pain 04/04/2020   Uterine leiomyoma 04/04/2020   Dyspareunia, female 04/04/2020   Anemia 09/12/2019   Iron  deficiency 09/12/2019   Seborrheic dermatitis 07/31/2019   Uterine fibroid 06/21/2019   S/P myomectomy 06/21/2019   Chronic constipation 01/10/2017   Vitamin D  deficiency 01/10/2017   Fatigue 01/10/2017    Past Medical History: Past Medical History:  Diagnosis Date   Allergic contact dermatitis 07/19/2019   Allergic rhinitis 01/10/2017   Anxiety    Constipation    sees Dr. Renaye Sous   Dermatological disease    sees Dr. 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   Hyperpigmentation 11/04/2016   Liver cyst    Loose stools 04/04/2020   Pain of right hip 10/12/2023   Pruritus 09/12/2019   Rash 09/12/2019   Screening for diabetes mellitus 08/23/2022   Screening for lipid  disorders 08/23/2022   Seasonal allergic rhinitis    Uterine fibroid     Past Surgical History: Past Surgical History:  Procedure Laterality Date   BREAST BIOPSY Left    BREAST LUMPECTOMY WITH RADIOACTIVE SEED LOCALIZATION Left 04/01/2022   Procedure: LEFT BREAST LUMPECTOMY WITH RADIOACTIVE SEED LOCALIZATION;  Surgeon: Belinda Cough, MD;  Location: [facility];  Service: General;  Laterality: Left;   COLONOSCOPY  12/2022   [facility], diverticulosis, repeat 10 years   MYOMECTOMY  05/03/2012   Procedure: MYOMECTOMY;  Surgeon: Dickie DELENA Carder, MD;  Location: [facility];  Service: Gynecology;  Laterality: N/A;   Exploratory Laparotomy myomectomy. Open at 1410.   MYOMECTOMY  06/21/2019   Exploratory Laparotomy MYOMECTOMY (N/A Abdomen)   MYOMECTOMY N/A 06/21/2019   Procedure: Exploratory Laparotomy MYOMECTOMY;  Surgeon: Carder Dickie, MD;  Location: [facility];  Service: Gynecology;  Laterality: N/A;   ROBOT ASSISTED MYOMECTOMY  05/03/2012   Procedure: ROBOTIC ASSISTED MYOMECTOMY;  Surgeon: Dickie DELENA Carder, MD;  Location: [facility];  Service: Gynecology;  Laterality: N/A;  Times two fibroids with robot.   WISDOM TOOTH EXTRACTION  2011      OB History:  OB History  No obstetric history on file.    Family History: Family History  Problem  Relation Age of Onset   COPD Mother    Appendicitis Mother    Non-Hodgkin's lymphoma Mother 51   Aortic aneurysm Mother    AAA (abdominal aortic aneurysm) Mother    COPD Father        emphysema   Hypertension Father    Eczema Father    Kidney disease Sister    Kidney cancer Sister 67   Cerebral palsy Sister    Fibroids Sister    Arthritis Brother    Cancer Maternal Uncle        liver   Breast cancer Paternal Aunt 36   Heart disease Paternal Grandfather        died of MI   Hypertension Cousin    Stroke Neg Hx    Diabetes Neg Hx     Social History: Social History   Socioeconomic History   Marital status: Married     Spouse name: Not on file   Number of children: Not on file   Years of education: Not on file   Highest education level: Not on file  Occupational History   Not on file  Tobacco Use   Smoking status: Former    Types: Cigars   Smokeless tobacco: Never   Tobacco comments:    cigars occaionally  Vaping Use   Vaping status: Never Used  Substance and Sexual Activity   Alcohol use: Yes    Comment: socially   Drug use: No   Sexual activity: Not on file  Other Topics Concern   Not on file  Social History Narrative   Married, no children, exercise - some with treadmill, cardio, goes to gym, works at [employer].  10/2023   Social Drivers of Health   Tobacco Use: Medium Risk (10/17/2024)   Patient History    Smoking Tobacco Use: Former    Smokeless Tobacco Use: Never    Passive Exposure: Not on file  Financial Resource Strain: Medium Risk (10/12/2023)   Overall Financial Resource Strain (CARDIA)    Difficulty of Paying Living Expenses: Somewhat hard  Food Insecurity: No Food Insecurity (10/12/2023)   Hunger Vital Sign    Worried About Running Out of Food in the Last Year: Never true    Ran Out of Food in the Last Year: Never true  Transportation Needs: No Transportation Needs (10/12/2023)   PRAPARE - Administrator, Civil Service (Medical): No    Lack of Transportation (Non-Medical): No  Physical Activity: Not on file  Stress: Stress Concern Present (10/12/2023)   [employer] of Occupational Health - Occupational Stress Questionnaire    Feeling of Stress : To some extent  Social Connections: Unknown (10/12/2023)   Social Connection and Isolation Panel    Frequency of Communication with Friends and Family: Twice a week    Frequency of Social Gatherings with Friends and Family: Not on file    Attends Religious Services: 1 to 4 times per year    Active Member of [employer] or Organizations: No    Attends Banker Meetings: 1 to 4 times per year     Marital Status: Married  Catering Manager Violence: Not At Risk (10/12/2023)   Humiliation, Afraid, Rape, and Kick questionnaire    Fear of Current or Ex-Partner: No    Emotionally Abused: No    Physically Abused: No    Sexually Abused: No  Depression (PHQ2-9): Low Risk (10/17/2024)   Depression (PHQ2-9)    PHQ-2 Score: 0  Alcohol Screen: Not on  file  Housing: Low Risk (10/12/2023)   Housing    Last Housing Risk Score: 0  Utilities: Not At Risk (10/12/2023)   [facility] Utilities    Threatened with loss of utilities: No  Health Literacy: Not on file    Allergies: Allergies  Allergen Reactions   Other Hives    Dermabond   Tape Itching    Current Medications: Current Outpatient Medications  Medication Sig Dispense Refill   Drospirenone (SLYND) 4 MG TABS Take 4 mg by mouth at bedtime.     fluticasone  (FLONASE ) 50 MCG/ACT nasal spray SPRAY 2 SPRAYS INTO EACH NOSTRIL EVERY DAY 16 mL 3   Probiotic Product (PROBIOTIC DAILY PO) Take 1 capsule by mouth daily.      vitamin B-12 (CYANOCOBALAMIN ) 1000 MCG tablet Take 1,000 mcg by mouth daily.     Vitamin D , Ergocalciferol , (DRISDOL ) 1.25 MG (50000 UNIT) CAPS capsule Take 1 capsule (50,000 Units total) by mouth every 7 (seven) days. 12 capsule 3   No current facility-administered medications for this visit.    Review of Systems General: negative for, fevers, chills, fatigue, changes in sleep, changes in weight or appetite Skin: negative for changes in color, texture, moles or lesions Eyes: negative for, changes in vision, pain, diplopia HEENT: negative for, change in hearing, pain, discharge, tinnitus, vertigo, voice changes, sore throat, neck masses Breasts: negative for breast lumps Pulmonary: negative for, dyspnea, orthopnea, productive cough Cardiac: negative for, palpitations, syncope, pain, discomfort, pressure Gastrointestinal: negative for, dysphagia, nausea, vomiting, jaundice, pain, constipation, diarrhea, hematemesis,  hematochezia Genitourinary/Sexual: negative for, dysuria, discharge, hesitancy, nocturia, retention, stones, infections, STD's, incontinence Ob/Gyn: negative for, irregular bleeding, pain Musculoskeletal: negative for, pain, stiffness, swelling, range of motion limitation Hematology: negative for, easy bruising, bleeding Neurologic/Psych: negative for, headaches, seizures, paralysis, weakness, tremor, change in gait, change in sensation, mood swings, depression, anxiety, change in memory  Objective:  Physical Examination:  BP 97/71 (BP Location: Left Arm, Patient Position: Sitting)   Pulse 82   Temp 98.1 F (36.7 C) (Tympanic)   Ht 5' 3 (1.6 m)   Wt 165 lb (74.8 kg)   LMP 10/13/2024   SpO2 100%   BMI 29.23 kg/m    ECOG Performance Status: 0 - Asymptomatic  General appearance: alert, cooperative, and appears stated age HEENT:PERRLA and thyroid without masses Lymph node survey: non-palpable, axillary, inguinal, supraclavicular Cardiovascular: regular rate and rhythm, no murmurs or gallops Respiratory: normal air entry, lungs clear to auscultation Abdomen: soft, non-tender, without masses or organomegaly, no hernias, and well healed incision Back: inspection of back is normal Extremities: extremities normal, atraumatic, no cyanosis or edema Skin exam - normal coloration and turgor, no rashes, no suspicious skin lesions noted. Neurological exam reveals alert, oriented, normal speech, no focal findings or movement disorder noted.  Pelvic: exam chaperoned by nurse;  Vulva: normal appearing vulva with no masses, tenderness or lesions; Vagina: normal vagina; Adnexa: normal adnexa in size, nontender and no masses; Uterus: enlarged to about 12 weeks size anteriorly; Cervix: no lesions; Rectal: not indicated        Assessment:  Susan Drake is a 43 y.o. P0 female diagnosed with fibroid uterus and left adnexal cyst seen on CT scan for RUQ abdominal pain 8/25.  The pain has resolved.  CT  also showed liver cysts.  She was seen by Gyn and had slightly elevated CA125 (66). She has not had a period since June and has some hot flashes but FSH still premenopausal.  Suspect she is perimenopausal.  Pelvic US  showed multiple fibroids, 12 wk size by my exam, and 5 cm simple cyst in left adnexa.  The ovarian cyst could be a hydrosalpinx as it is oblong. Most likely the slightly elevated CA125 is due to fibroids and benign ovarian cyst.  Do not think that ovarian malignancy is likely.  Now back for 3 month follow up.  US  shows stable simple ovarian cyst and CA125 now normal.        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  Plan:   Problem List Items Addressed This Visit   None Visit Diagnoses       Cyst of ovary, unspecified laterality    -  Primary       She will follow up in 6 months with Dr German for repeat US  and CA125 and if stable probably does not need more scans unless concerning symptoms. She is not pursuing fertility and is not symptomatic in relation to the cyst or fibroids.  She is probably perimenopausal.  Dr German had suggested a progestin to elicit menses, but do not think this is necessary because she is not having any uterine bleeding.       We would be happy to see her back in the future should the need arise.   The patient's diagnosis, an outline of the further diagnostic and laboratory studies which will be required, the recommendation, and alternatives were discussed.  All questions were answered to the patient's satisfaction.  Tinnie Dawn, DNP, AGNP-C, AOCNP [facility] [phone] (clinic)  I personally had a face to face interaction and evaluated the patient jointly with the NP, Ms. Tinnie Dawn.  I have reviewed her history and available records and have performed the key portions of the physical exam including lymph node survey, abdominal exam, pelvic exam with my findings confirming those  documented above by the APP.  I have discussed the case with the APP and the patient.  I agree with the above documentation, assessment and plan which was fully formulated by me.  Counseling was completed by me.   I personally saw the patient and performed a substantive portion of this encounter in conjunction with the listed APP as documented above.  Prentice Agent, MD  CC:  Bulah Alm RAMAN, PA-C [address] [place],  [place] [address] [phone]

## 2024-10-24 ENCOUNTER — Inpatient Hospital Stay

## 2024-10-27 ENCOUNTER — Other Ambulatory Visit: Payer: Self-pay | Admitting: Medical

## 2024-12-03 ENCOUNTER — Other Ambulatory Visit: Payer: Self-pay | Admitting: Medical

## 2024-12-03 DIAGNOSIS — R5383 Other fatigue: Secondary | ICD-10-CM

## 2024-12-03 DIAGNOSIS — D649 Anemia, unspecified: Secondary | ICD-10-CM

## 2024-12-03 DIAGNOSIS — Z131 Encounter for screening for diabetes mellitus: Secondary | ICD-10-CM

## 2024-12-03 DIAGNOSIS — Z1389 Encounter for screening for other disorder: Secondary | ICD-10-CM

## 2024-12-03 DIAGNOSIS — Z0000 Encounter for general adult medical examination without abnormal findings: Secondary | ICD-10-CM

## 2024-12-03 DIAGNOSIS — Z13220 Encounter for screening for lipoid disorders: Secondary | ICD-10-CM

## 2024-12-03 DIAGNOSIS — E611 Iron deficiency: Secondary | ICD-10-CM

## 2024-12-03 DIAGNOSIS — E559 Vitamin D deficiency, unspecified: Secondary | ICD-10-CM

## 2024-12-03 DIAGNOSIS — R635 Abnormal weight gain: Secondary | ICD-10-CM

## 2024-12-07 ENCOUNTER — Other Ambulatory Visit

## 2024-12-07 DIAGNOSIS — E611 Iron deficiency: Secondary | ICD-10-CM

## 2024-12-07 DIAGNOSIS — Z0000 Encounter for general adult medical examination without abnormal findings: Secondary | ICD-10-CM

## 2024-12-07 DIAGNOSIS — D649 Anemia, unspecified: Secondary | ICD-10-CM

## 2024-12-07 DIAGNOSIS — R5383 Other fatigue: Secondary | ICD-10-CM

## 2024-12-07 DIAGNOSIS — R635 Abnormal weight gain: Secondary | ICD-10-CM

## 2024-12-07 DIAGNOSIS — Z13220 Encounter for screening for lipoid disorders: Secondary | ICD-10-CM

## 2024-12-07 LAB — COMPREHENSIVE METABOLIC PANEL WITH GFR
ALT: 12 [IU]/L (ref 0–32)
AST: 15 [IU]/L (ref 0–40)
Albumin: 4.4 g/dL (ref 3.9–4.9)
Alkaline Phosphatase: 35 [IU]/L — ABNORMAL LOW (ref 41–116)
BUN/Creatinine Ratio: 13 (ref 9–23)
BUN: 13 mg/dL (ref 6–24)
Bilirubin Total: 0.6 mg/dL (ref 0.0–1.2)
CO2: 21 mmol/L (ref 20–29)
Calcium: 8.9 mg/dL (ref 8.7–10.2)
Chloride: 104 mmol/L (ref 96–106)
Creatinine, Ser: 1.01 mg/dL — ABNORMAL HIGH (ref 0.57–1.00)
Globulin, Total: 2.4 g/dL (ref 1.5–4.5)
Glucose: 83 mg/dL (ref 70–99)
Potassium: 4.3 mmol/L (ref 3.5–5.2)
Sodium: 138 mmol/L (ref 134–144)
Total Protein: 6.8 g/dL (ref 6.0–8.5)
eGFR: 70 mL/min/{1.73_m2}

## 2024-12-07 LAB — FOLATE

## 2024-12-07 LAB — CBC
Hematocrit: 38.9 % (ref 34.0–46.6)
Hemoglobin: 11.8 g/dL (ref 11.1–15.9)
MCH: 25.5 pg — ABNORMAL LOW (ref 26.6–33.0)
MCHC: 30.3 g/dL — ABNORMAL LOW (ref 31.5–35.7)
MCV: 84 fL (ref 79–97)
Platelets: 146 10*3/uL — ABNORMAL LOW (ref 150–450)
RBC: 4.63 x10E6/uL (ref 3.77–5.28)
RDW: 12.7 % (ref 11.7–15.4)
WBC: 4.4 10*3/uL (ref 3.4–10.8)

## 2024-12-07 LAB — LIPID PANEL
Chol/HDL Ratio: 3.1 ratio (ref 0.0–4.4)
Cholesterol, Total: 166 mg/dL (ref 100–199)
HDL: 54 mg/dL
LDL Chol Calc (NIH): 104 mg/dL — ABNORMAL HIGH (ref 0–99)
Triglycerides: 39 mg/dL (ref 0–149)
VLDL Cholesterol Cal: 8 mg/dL (ref 5–40)

## 2024-12-07 LAB — INSULIN, RANDOM

## 2024-12-07 LAB — IRON: Iron: 77 ug/dL (ref 27–159)

## 2024-12-07 LAB — TSH: TSH: 1.61 u[IU]/mL (ref 0.450–4.500)

## 2024-12-07 LAB — CORTISOL

## 2024-12-07 LAB — VITAMIN B12

## 2024-12-12 ENCOUNTER — Encounter: Admitting: Medical

## 2025-01-29 ENCOUNTER — Encounter: Admitting: Medical

## 2025-07-15 ENCOUNTER — Ambulatory Visit: Admitting: Oncology

## 2025-07-15 ENCOUNTER — Other Ambulatory Visit
